# Patient Record
Sex: Male | Born: 1935 | Race: Black or African American | Hispanic: No
Health system: Southern US, Community
[De-identification: ages and names within clinical notes are randomized; demographics above are authoritative.]

---

## 2004-06-16 ENCOUNTER — Emergency Department: Payer: Self-pay | Admitting: Emergency Medicine

## 2005-08-25 ENCOUNTER — Ambulatory Visit: Payer: Self-pay | Admitting: Internal Medicine

## 2005-08-26 ENCOUNTER — Ambulatory Visit: Payer: Self-pay | Admitting: Internal Medicine

## 2007-04-04 ENCOUNTER — Ambulatory Visit: Payer: Self-pay | Admitting: Gastroenterology

## 2007-07-04 ENCOUNTER — Ambulatory Visit: Payer: Self-pay | Admitting: Gastroenterology

## 2007-07-24 ENCOUNTER — Ambulatory Visit: Payer: Self-pay | Admitting: Gastroenterology

## 2008-07-29 ENCOUNTER — Ambulatory Visit: Payer: Self-pay | Admitting: Internal Medicine

## 2008-08-26 ENCOUNTER — Ambulatory Visit: Payer: Self-pay | Admitting: Internal Medicine

## 2008-08-29 ENCOUNTER — Ambulatory Visit: Payer: Self-pay | Admitting: Internal Medicine

## 2008-09-15 ENCOUNTER — Emergency Department: Payer: Self-pay | Admitting: Emergency Medicine

## 2008-09-28 ENCOUNTER — Ambulatory Visit: Payer: Self-pay | Admitting: Internal Medicine

## 2008-10-29 ENCOUNTER — Ambulatory Visit: Payer: Self-pay | Admitting: Internal Medicine

## 2008-10-30 ENCOUNTER — Ambulatory Visit: Payer: Self-pay | Admitting: Nephrology

## 2008-10-31 ENCOUNTER — Ambulatory Visit: Payer: Self-pay | Admitting: Internal Medicine

## 2008-11-28 ENCOUNTER — Ambulatory Visit: Payer: Self-pay | Admitting: Internal Medicine

## 2008-12-29 ENCOUNTER — Ambulatory Visit: Payer: Self-pay | Admitting: Internal Medicine

## 2009-01-29 ENCOUNTER — Ambulatory Visit: Payer: Self-pay | Admitting: Internal Medicine

## 2009-01-30 ENCOUNTER — Ambulatory Visit: Payer: Self-pay | Admitting: Internal Medicine

## 2009-02-28 ENCOUNTER — Ambulatory Visit: Payer: Self-pay | Admitting: Internal Medicine

## 2009-03-18 ENCOUNTER — Ambulatory Visit: Payer: Self-pay | Admitting: Internal Medicine

## 2009-03-31 ENCOUNTER — Ambulatory Visit: Payer: Self-pay | Admitting: Internal Medicine

## 2009-04-30 ENCOUNTER — Ambulatory Visit: Payer: Self-pay | Admitting: Internal Medicine

## 2009-05-02 ENCOUNTER — Ambulatory Visit: Payer: Self-pay | Admitting: Internal Medicine

## 2009-05-13 ENCOUNTER — Ambulatory Visit: Payer: Self-pay | Admitting: Urology

## 2009-05-14 ENCOUNTER — Ambulatory Visit: Payer: Self-pay | Admitting: Internal Medicine

## 2009-05-31 ENCOUNTER — Ambulatory Visit: Payer: Self-pay | Admitting: Internal Medicine

## 2009-07-01 ENCOUNTER — Ambulatory Visit: Payer: Self-pay | Admitting: Internal Medicine

## 2009-07-29 ENCOUNTER — Ambulatory Visit: Payer: Self-pay | Admitting: Internal Medicine

## 2009-08-29 ENCOUNTER — Ambulatory Visit: Payer: Self-pay | Admitting: Internal Medicine

## 2009-09-28 ENCOUNTER — Ambulatory Visit: Payer: Self-pay | Admitting: Internal Medicine

## 2009-10-29 ENCOUNTER — Ambulatory Visit: Payer: Self-pay | Admitting: Internal Medicine

## 2009-11-28 ENCOUNTER — Ambulatory Visit: Payer: Self-pay | Admitting: Internal Medicine

## 2010-01-02 ENCOUNTER — Ambulatory Visit: Payer: Self-pay | Admitting: Internal Medicine

## 2010-01-21 ENCOUNTER — Ambulatory Visit: Payer: Self-pay | Admitting: Internal Medicine

## 2010-01-29 ENCOUNTER — Ambulatory Visit: Payer: Self-pay | Admitting: Internal Medicine

## 2010-02-28 ENCOUNTER — Ambulatory Visit: Payer: Self-pay | Admitting: Internal Medicine

## 2010-03-31 ENCOUNTER — Ambulatory Visit: Payer: Self-pay | Admitting: Internal Medicine

## 2010-04-30 ENCOUNTER — Ambulatory Visit: Payer: Self-pay | Admitting: Internal Medicine

## 2010-05-12 ENCOUNTER — Ambulatory Visit: Payer: Self-pay | Admitting: Urology

## 2010-05-22 ENCOUNTER — Inpatient Hospital Stay: Payer: Self-pay | Admitting: Surgery

## 2010-06-17 ENCOUNTER — Emergency Department: Payer: Self-pay | Admitting: Emergency Medicine

## 2010-07-14 ENCOUNTER — Ambulatory Visit: Payer: Self-pay | Admitting: Internal Medicine

## 2010-07-30 ENCOUNTER — Ambulatory Visit: Payer: Self-pay | Admitting: Internal Medicine

## 2010-08-11 ENCOUNTER — Ambulatory Visit: Payer: Self-pay | Admitting: Internal Medicine

## 2010-08-30 ENCOUNTER — Ambulatory Visit: Payer: Self-pay | Admitting: Internal Medicine

## 2010-09-29 ENCOUNTER — Ambulatory Visit: Payer: Self-pay | Admitting: Internal Medicine

## 2010-10-30 ENCOUNTER — Ambulatory Visit: Payer: Self-pay | Admitting: Internal Medicine

## 2010-11-29 ENCOUNTER — Ambulatory Visit: Payer: Self-pay | Admitting: Internal Medicine

## 2010-12-30 ENCOUNTER — Ambulatory Visit: Payer: Self-pay | Admitting: Internal Medicine

## 2011-01-30 ENCOUNTER — Ambulatory Visit: Payer: Self-pay | Admitting: Internal Medicine

## 2011-03-01 ENCOUNTER — Ambulatory Visit: Payer: Self-pay | Admitting: Internal Medicine

## 2011-03-10 ENCOUNTER — Other Ambulatory Visit: Payer: Self-pay | Admitting: Nephrology

## 2011-03-30 ENCOUNTER — Ambulatory Visit: Payer: Self-pay | Admitting: Internal Medicine

## 2011-04-01 ENCOUNTER — Ambulatory Visit: Payer: Self-pay | Admitting: Internal Medicine

## 2011-05-07 ENCOUNTER — Ambulatory Visit: Payer: Self-pay

## 2011-05-17 ENCOUNTER — Ambulatory Visit: Payer: Self-pay | Admitting: Urology

## 2011-05-26 ENCOUNTER — Ambulatory Visit: Payer: Self-pay | Admitting: Internal Medicine

## 2011-05-28 ENCOUNTER — Ambulatory Visit: Payer: Self-pay | Admitting: Vascular Surgery

## 2011-06-08 ENCOUNTER — Ambulatory Visit: Payer: Self-pay | Admitting: Internal Medicine

## 2011-06-11 ENCOUNTER — Ambulatory Visit: Payer: Self-pay | Admitting: Internal Medicine

## 2011-06-23 ENCOUNTER — Ambulatory Visit: Payer: Self-pay | Admitting: Vascular Surgery

## 2011-06-23 LAB — BASIC METABOLIC PANEL
Calcium, Total: 8 mg/dL — ABNORMAL LOW (ref 8.5–10.1)
Creatinine: 2.97 mg/dL — ABNORMAL HIGH (ref 0.60–1.30)
Glucose: 84 mg/dL (ref 65–99)
Sodium: 141 mmol/L (ref 136–145)

## 2011-07-02 ENCOUNTER — Ambulatory Visit: Payer: Self-pay | Admitting: Internal Medicine

## 2011-07-30 ENCOUNTER — Ambulatory Visit: Payer: Self-pay | Admitting: Internal Medicine

## 2011-09-06 ENCOUNTER — Inpatient Hospital Stay: Payer: Self-pay | Admitting: Internal Medicine

## 2011-09-06 LAB — URINALYSIS, COMPLETE
Bacteria: NONE SEEN
Bilirubin,UR: NEGATIVE
Blood: NEGATIVE
Ph: 7 (ref 4.5–8.0)
Protein: 100
Squamous Epithelial: NONE SEEN
WBC UR: NONE SEEN /HPF (ref 0–5)

## 2011-09-06 LAB — BASIC METABOLIC PANEL
Calcium, Total: 8 mg/dL — ABNORMAL LOW (ref 8.5–10.1)
Co2: 22 mmol/L (ref 21–32)
Creatinine: 3.26 mg/dL — ABNORMAL HIGH (ref 0.60–1.30)
EGFR (African American): 24 — ABNORMAL LOW
Potassium: 6.8 mmol/L (ref 3.5–5.1)

## 2011-09-06 LAB — CBC WITH DIFFERENTIAL/PLATELET
Basophil %: 0.5 %
HCT: 27.5 % — ABNORMAL LOW (ref 40.0–52.0)
HGB: 8.8 g/dL — ABNORMAL LOW (ref 13.0–18.0)
Lymphocyte #: 1.1 10*3/uL (ref 1.0–3.6)
Lymphocyte %: 17.2 %
MCH: 26.4 pg (ref 26.0–34.0)
MCHC: 32.1 g/dL (ref 32.0–36.0)
MCV: 82 fL (ref 80–100)
Neutrophil #: 3.8 10*3/uL (ref 1.4–6.5)
Platelet: 149 10*3/uL — ABNORMAL LOW (ref 150–440)
RBC: 3.34 10*6/uL — ABNORMAL LOW (ref 4.40–5.90)

## 2011-09-06 LAB — POTASSIUM
Potassium: 6.7 mmol/L (ref 3.5–5.1)
Potassium: 5.8 mmol/L — ABNORMAL HIGH (ref 3.5–5.1)

## 2011-09-07 LAB — BASIC METABOLIC PANEL
Anion Gap: 8 (ref 7–16)
Chloride: 107 mmol/L (ref 98–107)
Co2: 25 mmol/L (ref 21–32)
Creatinine: 2.37 mg/dL — ABNORMAL HIGH (ref 0.60–1.30)
EGFR (African American): 34 — ABNORMAL LOW
EGFR (Non-African Amer.): 28 — ABNORMAL LOW
Glucose: 74 mg/dL (ref 65–99)
Potassium: 5.3 mmol/L — ABNORMAL HIGH (ref 3.5–5.1)

## 2011-09-07 LAB — CBC WITH DIFFERENTIAL/PLATELET
Basophil %: 0.3 %
HCT: 24.9 % — ABNORMAL LOW (ref 40.0–52.0)
HGB: 7.9 g/dL — ABNORMAL LOW (ref 13.0–18.0)
Lymphocyte #: 1.5 10*3/uL (ref 1.0–3.6)
Lymphocyte %: 23.5 %
MCH: 26.1 pg (ref 26.0–34.0)
MCHC: 31.7 g/dL — ABNORMAL LOW (ref 32.0–36.0)
Monocyte #: 0.6 10*3/uL (ref 0.0–0.7)
Platelet: 105 10*3/uL — ABNORMAL LOW (ref 150–440)
RBC: 3.02 10*6/uL — ABNORMAL LOW (ref 4.40–5.90)

## 2011-09-07 LAB — CREATININE CLEARANCE, URINE, 24 HOUR
Creatinine Clearance: 23 mL/min — ABNORMAL LOW (ref 80–130)
Creatinine, Serum: 2.37 mg/dL — ABNORMAL HIGH (ref 0.50–1.20)
Creatinine, Urine: 75.5 mg/dL (ref 30.0–125.0)
Total Volume: 875 mL

## 2011-09-08 LAB — CBC WITH DIFFERENTIAL/PLATELET
Basophil #: 0 10*3/uL (ref 0.0–0.1)
Basophil %: 0.3 %
Eosinophil #: 1 10*3/uL — ABNORMAL HIGH (ref 0.0–0.7)
MCH: 26.4 pg (ref 26.0–34.0)
MCV: 82 fL (ref 80–100)
Neutrophil #: 3.7 10*3/uL (ref 1.4–6.5)
Neutrophil %: 56.6 %
Platelet: 107 10*3/uL — ABNORMAL LOW (ref 150–440)
RDW: 17.9 % — ABNORMAL HIGH (ref 11.5–14.5)
WBC: 6.5 10*3/uL (ref 3.8–10.6)

## 2011-09-08 LAB — BASIC METABOLIC PANEL
Anion Gap: 7 (ref 7–16)
BUN: 34 mg/dL — ABNORMAL HIGH (ref 7–18)
Calcium, Total: 7.2 mg/dL — ABNORMAL LOW (ref 8.5–10.1)
Chloride: 109 mmol/L — ABNORMAL HIGH (ref 98–107)
Glucose: 73 mg/dL (ref 65–99)
Osmolality: 284 (ref 275–301)
Sodium: 139 mmol/L (ref 136–145)

## 2011-09-09 LAB — CBC WITH DIFFERENTIAL/PLATELET
Eosinophil #: 0 10*3/uL (ref 0.0–0.7)
HCT: 26.8 % — ABNORMAL LOW (ref 40.0–52.0)
HGB: 8.7 g/dL — ABNORMAL LOW (ref 13.0–18.0)
Lymphocyte #: 0.5 10*3/uL — ABNORMAL LOW (ref 1.0–3.6)
Lymphocyte %: 9.3 %
MCH: 26.9 pg (ref 26.0–34.0)
MCHC: 32.3 g/dL (ref 32.0–36.0)
MCV: 83 fL (ref 80–100)
Neutrophil #: 4.4 10*3/uL (ref 1.4–6.5)
Neutrophil %: 78.9 %
Platelet: 63 10*3/uL — ABNORMAL LOW (ref 150–440)
RBC: 3.23 10*6/uL — ABNORMAL LOW (ref 4.40–5.90)
WBC: 5.6 10*3/uL (ref 3.8–10.6)

## 2011-09-09 LAB — BASIC METABOLIC PANEL
Anion Gap: 8 (ref 7–16)
BUN: 29 mg/dL — ABNORMAL HIGH (ref 7–18)
Calcium, Total: 7.3 mg/dL — ABNORMAL LOW (ref 8.5–10.1)
Chloride: 104 mmol/L (ref 98–107)
Co2: 27 mmol/L (ref 21–32)
Creatinine: 2.08 mg/dL — ABNORMAL HIGH (ref 0.60–1.30)
EGFR (Non-African Amer.): 33 — ABNORMAL LOW
Sodium: 139 mmol/L (ref 136–145)

## 2011-09-14 ENCOUNTER — Ambulatory Visit: Payer: Self-pay | Admitting: Internal Medicine

## 2011-09-22 ENCOUNTER — Ambulatory Visit: Payer: Self-pay | Admitting: Vascular Surgery

## 2011-09-22 LAB — BASIC METABOLIC PANEL
BUN: 46 mg/dL — ABNORMAL HIGH (ref 7–18)
Creatinine: 4.03 mg/dL — ABNORMAL HIGH (ref 0.60–1.30)
EGFR (African American): 15 — ABNORMAL LOW
Glucose: 76 mg/dL (ref 65–99)
Potassium: 4.5 mmol/L (ref 3.5–5.1)
Sodium: 140 mmol/L (ref 136–145)

## 2011-09-22 LAB — CBC
MCH: 26.5 pg (ref 26.0–34.0)
MCHC: 31.7 g/dL — ABNORMAL LOW (ref 32.0–36.0)
MCV: 84 fL (ref 80–100)
WBC: 7.6 10*3/uL (ref 3.8–10.6)

## 2011-09-27 ENCOUNTER — Other Ambulatory Visit: Payer: Self-pay | Admitting: Vascular Surgery

## 2011-09-29 ENCOUNTER — Ambulatory Visit: Payer: Self-pay | Admitting: Vascular Surgery

## 2011-09-29 LAB — POTASSIUM: Potassium: 4.4 mmol/L (ref 3.5–5.1)

## 2011-09-30 ENCOUNTER — Ambulatory Visit: Payer: Self-pay | Admitting: Internal Medicine

## 2011-11-09 ENCOUNTER — Ambulatory Visit: Payer: Self-pay | Admitting: Internal Medicine

## 2011-11-09 LAB — IRON AND TIBC
Iron Bind.Cap.(Total): 178 ug/dL — ABNORMAL LOW (ref 250–450)
Iron: 70 ug/dL (ref 65–175)
Unbound Iron-Bind.Cap.: 108 ug/dL

## 2011-11-09 LAB — CANCER CENTER HEMOGLOBIN: HGB: 8 g/dL — ABNORMAL LOW (ref 13.0–18.0)

## 2011-11-29 ENCOUNTER — Ambulatory Visit: Payer: Self-pay | Admitting: Internal Medicine

## 2011-11-30 LAB — CANCER CENTER HEMOGLOBIN: HGB: 10.7 g/dL — ABNORMAL LOW (ref 13.0–18.0)

## 2011-12-06 ENCOUNTER — Ambulatory Visit: Payer: Self-pay | Admitting: Internal Medicine

## 2011-12-14 ENCOUNTER — Ambulatory Visit: Payer: Self-pay | Admitting: Internal Medicine

## 2011-12-16 ENCOUNTER — Ambulatory Visit: Payer: Self-pay | Admitting: Internal Medicine

## 2011-12-16 LAB — BASIC METABOLIC PANEL
Anion Gap: 10 (ref 7–16)
Calcium, Total: 7.8 mg/dL — ABNORMAL LOW (ref 8.5–10.1)
Co2: 22 mmol/L (ref 21–32)
Creatinine: 5.39 mg/dL — ABNORMAL HIGH (ref 0.60–1.30)
EGFR (African American): 11 — ABNORMAL LOW

## 2011-12-16 LAB — CBC WITH DIFFERENTIAL/PLATELET
Basophil %: 0.4 %
Eosinophil #: 0.4 10*3/uL (ref 0.0–0.7)
HCT: 37.1 % — ABNORMAL LOW (ref 40.0–52.0)
MCH: 24.3 pg — ABNORMAL LOW (ref 26.0–34.0)
MCHC: 29.9 g/dL — ABNORMAL LOW (ref 32.0–36.0)
MCV: 81 fL (ref 80–100)
Neutrophil %: 85.7 %
Platelet: 292 10*3/uL (ref 150–440)

## 2011-12-28 LAB — CANCER CENTER HEMOGLOBIN: HGB: 11.6 g/dL — ABNORMAL LOW (ref 13.0–18.0)

## 2011-12-30 ENCOUNTER — Ambulatory Visit: Payer: Self-pay | Admitting: Internal Medicine

## 2012-01-01 ENCOUNTER — Inpatient Hospital Stay: Payer: Self-pay | Admitting: Internal Medicine

## 2012-01-01 LAB — COMPREHENSIVE METABOLIC PANEL
Albumin: 2.4 g/dL — ABNORMAL LOW (ref 3.4–5.0)
Anion Gap: 11 (ref 7–16)
Calcium, Total: 7.9 mg/dL — ABNORMAL LOW (ref 8.5–10.1)
Chloride: 102 mmol/L (ref 98–107)
Co2: 23 mmol/L (ref 21–32)
EGFR (African American): 9 — ABNORMAL LOW
EGFR (Non-African Amer.): 8 — ABNORMAL LOW
Osmolality: 284 (ref 275–301)
Potassium: 4.4 mmol/L (ref 3.5–5.1)
SGOT(AST): 18 U/L (ref 15–37)
SGPT (ALT): 8 U/L — ABNORMAL LOW (ref 12–78)
Sodium: 136 mmol/L (ref 136–145)

## 2012-01-01 LAB — URINALYSIS, COMPLETE
Bacteria: NONE SEEN
Bilirubin,UR: NEGATIVE
Blood: NEGATIVE
Glucose,UR: NEGATIVE mg/dL (ref 0–75)
Leukocyte Esterase: NEGATIVE
Nitrite: NEGATIVE
Ph: 6 (ref 4.5–8.0)
Protein: >=500
Specific Gravity: 1.011 (ref 1.003–1.030)

## 2012-01-01 LAB — TROPONIN I: Troponin-I: <0.02 ng/mL

## 2012-01-01 LAB — CBC
HCT: 37.7 % — ABNORMAL LOW (ref 40.0–52.0)
MCHC: 31.7 g/dL — ABNORMAL LOW (ref 32.0–36.0)
RBC: 4.93 10*6/uL (ref 4.40–5.90)
RDW: 20.6 % — ABNORMAL HIGH (ref 11.5–14.5)
WBC: 9.1 10*3/uL (ref 3.8–10.6)

## 2012-01-01 LAB — CK TOTAL AND CKMB (NOT AT ARMC): CK-MB: 1.4 ng/mL (ref 0.5–3.6)

## 2012-01-02 LAB — CLOSTRIDIUM DIFFICILE BY PCR

## 2012-01-02 LAB — CBC WITH DIFFERENTIAL/PLATELET
Basophil #: 0 10*3/uL (ref 0.0–0.1)
Eosinophil #: 0.9 10*3/uL — ABNORMAL HIGH (ref 0.0–0.7)
HCT: 34.6 % — ABNORMAL LOW (ref 40.0–52.0)
Lymphocyte #: 0.8 10*3/uL — ABNORMAL LOW (ref 1.0–3.6)
MCHC: 31.1 g/dL — ABNORMAL LOW (ref 32.0–36.0)
Monocyte #: 0.6 x10 3/mm (ref 0.2–1.0)
Neutrophil %: 68.5 %
Platelet: 236 10*3/uL (ref 150–440)
RBC: 4.54 10*6/uL (ref 4.40–5.90)
WBC: 7.2 10*3/uL (ref 3.8–10.6)

## 2012-01-02 LAB — BASIC METABOLIC PANEL
Calcium, Total: 7.4 mg/dL — ABNORMAL LOW (ref 8.5–10.1)
Co2: 20 mmol/L — ABNORMAL LOW (ref 21–32)
Creatinine: 5.68 mg/dL — ABNORMAL HIGH (ref 0.60–1.30)
EGFR (African American): 10 — ABNORMAL LOW
Osmolality: 284 (ref 275–301)
Potassium: 4 mmol/L (ref 3.5–5.1)
Sodium: 136 mmol/L (ref 136–145)

## 2012-01-03 LAB — WBCS, STOOL

## 2012-01-04 LAB — BASIC METABOLIC PANEL
Anion Gap: 13 (ref 7–16)
Calcium, Total: 7.2 mg/dL — ABNORMAL LOW (ref 8.5–10.1)
Co2: 20 mmol/L — ABNORMAL LOW (ref 21–32)
Creatinine: 6.15 mg/dL — ABNORMAL HIGH (ref 0.60–1.30)
EGFR (African American): 9 — ABNORMAL LOW
EGFR (Non-African Amer.): 8 — ABNORMAL LOW
Glucose: 93 mg/dL (ref 65–99)
Potassium: 3.8 mmol/L (ref 3.5–5.1)
Sodium: 135 mmol/L — ABNORMAL LOW (ref 136–145)

## 2012-01-04 LAB — STOOL CULTURE

## 2012-01-05 LAB — PHOSPHORUS: Phosphorus: 4.8 mg/dL (ref 2.5–4.9)

## 2012-01-06 LAB — CBC WITH DIFFERENTIAL/PLATELET
Comment - H1-Com4: NORMAL
HGB: 10.5 g/dL — ABNORMAL LOW (ref 13.0–18.0)
Lymphocytes: 11 %
MCH: 23.7 pg — ABNORMAL LOW (ref 26.0–34.0)
MCHC: 31.7 g/dL — ABNORMAL LOW (ref 32.0–36.0)
RBC: 4.43 10*6/uL (ref 4.40–5.90)
Segmented Neutrophils: 74 %

## 2012-01-07 LAB — PHOSPHORUS: Phosphorus: 2.7 mg/dL (ref 2.5–4.9)

## 2012-01-09 LAB — IRON AND TIBC
Iron Saturation: 23 %
Iron: 34 ug/dL — ABNORMAL LOW (ref 65–175)

## 2012-01-09 LAB — CBC WITH DIFFERENTIAL/PLATELET
Basophil #: 0 10*3/uL (ref 0.0–0.1)
Basophil %: 0.4 %
Eosinophil #: 0.6 10*3/uL (ref 0.0–0.7)
Eosinophil %: 7.3 %
HGB: 9.8 g/dL — ABNORMAL LOW (ref 13.0–18.0)
Lymphocyte %: 10.5 %
MCH: 23.5 pg — ABNORMAL LOW (ref 26.0–34.0)
MCV: 76 fL — ABNORMAL LOW (ref 80–100)
Monocyte #: 0.8 x10 3/mm (ref 0.2–1.0)
Neutrophil %: 71.3 %
Platelet: 77 10*3/uL — ABNORMAL LOW (ref 150–440)
RDW: 20.4 % — ABNORMAL HIGH (ref 11.5–14.5)

## 2012-01-10 LAB — CBC WITH DIFFERENTIAL/PLATELET
Basophil %: 0.5 %
Eosinophil %: 6.2 %
HGB: 9.7 g/dL — ABNORMAL LOW (ref 13.0–18.0)
Lymphocyte #: 0.8 10*3/uL — ABNORMAL LOW (ref 1.0–3.6)
MCH: 23.7 pg — ABNORMAL LOW (ref 26.0–34.0)
MCV: 74 fL — ABNORMAL LOW (ref 80–100)
Monocyte #: 0.7 x10 3/mm (ref 0.2–1.0)
Monocyte %: 10.9 %
Platelet: 103 10*3/uL — ABNORMAL LOW (ref 150–440)
RBC: 4.08 10*6/uL — ABNORMAL LOW (ref 4.40–5.90)
RDW: 20.6 % — ABNORMAL HIGH (ref 11.5–14.5)
WBC: 6.7 10*3/uL (ref 3.8–10.6)

## 2012-01-10 LAB — PHOSPHORUS: Phosphorus: 3.6 mg/dL (ref 2.5–4.9)

## 2012-01-12 ENCOUNTER — Ambulatory Visit: Payer: Self-pay | Admitting: Internal Medicine

## 2012-01-13 LAB — STOOL CULTURE

## 2012-01-15 ENCOUNTER — Emergency Department: Payer: Self-pay | Admitting: Internal Medicine

## 2012-01-30 ENCOUNTER — Ambulatory Visit: Payer: Self-pay | Admitting: Internal Medicine

## 2012-02-02 ENCOUNTER — Other Ambulatory Visit (HOSPITAL_COMMUNITY): Payer: Self-pay | Admitting: Cardiothoracic Surgery

## 2012-02-02 DIAGNOSIS — R918 Other nonspecific abnormal finding of lung field: Secondary | ICD-10-CM

## 2012-02-02 LAB — CBC CANCER CENTER
Basophil #: 0.1 x10 3/mm (ref 0.0–0.1)
Eosinophil #: 0.3 x10 3/mm (ref 0.0–0.7)
HCT: 38.2 % — ABNORMAL LOW (ref 40.0–52.0)
Lymphocyte #: 1.1 x10 3/mm (ref 1.0–3.6)
Lymphocyte %: 15.2 %
MCHC: 31 g/dL — ABNORMAL LOW (ref 32.0–36.0)
Monocyte #: 0.7 x10 3/mm (ref 0.2–1.0)
Monocyte %: 9.2 %
Neutrophil #: 5.3 x10 3/mm (ref 1.4–6.5)
Neutrophil %: 71.5 %
Platelet: 212 x10 3/mm (ref 150–440)
RDW: 24.6 % — ABNORMAL HIGH (ref 11.5–14.5)
WBC: 7.4 x10 3/mm (ref 3.8–10.6)

## 2012-02-02 LAB — COMPREHENSIVE METABOLIC PANEL
Albumin: 2.7 g/dL — ABNORMAL LOW (ref 3.4–5.0)
Alkaline Phosphatase: 103 U/L (ref 50–136)
BUN: 30 mg/dL — ABNORMAL HIGH (ref 7–18)
Bilirubin,Total: 0.3 mg/dL (ref 0.2–1.0)
Co2: 30 mmol/L (ref 21–32)
Creatinine: 3.89 mg/dL — ABNORMAL HIGH (ref 0.60–1.30)
EGFR (Non-African Amer.): 14 — ABNORMAL LOW
Glucose: 85 mg/dL (ref 65–99)
SGOT(AST): 17 U/L (ref 15–37)
SGPT (ALT): 10 U/L — ABNORMAL LOW (ref 12–78)
Total Protein: 7.3 g/dL (ref 6.4–8.2)

## 2012-02-02 LAB — PROTIME-INR: INR: 0.9

## 2012-02-02 LAB — APTT: Activated PTT: 31.2 secs (ref 23.6–35.9)

## 2012-02-10 ENCOUNTER — Other Ambulatory Visit: Payer: Self-pay | Admitting: Physician Assistant

## 2012-02-15 ENCOUNTER — Other Ambulatory Visit: Payer: Self-pay | Admitting: Radiology

## 2012-02-15 ENCOUNTER — Other Ambulatory Visit (HOSPITAL_COMMUNITY): Payer: Self-pay

## 2012-02-15 ENCOUNTER — Ambulatory Visit (HOSPITAL_COMMUNITY): Payer: Self-pay

## 2012-02-16 ENCOUNTER — Encounter (HOSPITAL_COMMUNITY): Payer: Self-pay

## 2012-02-16 ENCOUNTER — Encounter (HOSPITAL_COMMUNITY): Payer: Self-pay | Admitting: Pharmacy Technician

## 2012-02-16 ENCOUNTER — Ambulatory Visit (HOSPITAL_COMMUNITY)
Admission: RE | Admit: 2012-02-16 | Discharge: 2012-02-16 | Disposition: A | Discharge status: Home / Self Care | Payer: Medicare Other | Source: Ambulatory Visit | Attending: Cardiothoracic Surgery | Admitting: Cardiothoracic Surgery

## 2012-02-16 DIAGNOSIS — R918 Other nonspecific abnormal finding of lung field: Secondary | ICD-10-CM

## 2012-02-16 DIAGNOSIS — J984 Other disorders of lung: Secondary | ICD-10-CM | POA: Insufficient documentation

## 2012-02-16 DIAGNOSIS — R222 Localized swelling, mass and lump, trunk: Secondary | ICD-10-CM | POA: Insufficient documentation

## 2012-02-16 LAB — CBC
HCT: 34 % — ABNORMAL LOW (ref 39.0–52.0)
Platelets: 195 10*3/uL (ref 150–400)
RBC: 4.53 MIL/uL (ref 4.22–5.81)
RDW: 21.1 % — ABNORMAL HIGH (ref 11.5–15.5)
WBC: 7.5 10*3/uL (ref 4.0–10.5)

## 2012-02-16 LAB — PROTIME-INR: INR: 0.98 (ref 0.00–1.49)

## 2012-02-16 LAB — APTT: aPTT: 31 seconds (ref 24–37)

## 2012-02-16 MED ORDER — MIDAZOLAM HCL 2 MG/2ML IJ SOLN
INTRAMUSCULAR | Status: AC
  Filled 2012-02-16: qty 6

## 2012-02-16 MED ORDER — SODIUM CHLORIDE 0.9 % IV SOLN: INTRAVENOUS | Status: DC

## 2012-02-16 MED ORDER — SODIUM CHLORIDE 0.9 % IV SOLN
INTRAVENOUS | Status: AC | PRN
  Administered 2012-02-16: 50 mL/h via INTRAVENOUS

## 2012-02-16 MED ORDER — FENTANYL CITRATE 0.05 MG/ML IJ SOLN
INTRAMUSCULAR | Status: AC | PRN
  Administered 2012-02-16: 25 ug via INTRAVENOUS
  Administered 2012-02-16: 12.5 ug via INTRAVENOUS

## 2012-02-16 MED ORDER — FENTANYL CITRATE 0.05 MG/ML IJ SOLN
INTRAMUSCULAR | Status: AC
  Filled 2012-02-16: qty 4

## 2012-02-16 MED ORDER — HYDROCODONE-ACETAMINOPHEN 5-325 MG PO TABS: 1.0000 | ORAL_TABLET | ORAL | Status: DC | PRN

## 2012-02-16 MED ORDER — MIDAZOLAM HCL 5 MG/5ML IJ SOLN
INTRAMUSCULAR | Status: AC | PRN
  Administered 2012-02-16: 1 mg via INTRAVENOUS

## 2012-02-16 NOTE — H&P (Signed)
[name] is an 78 y.o. male.   Chief Complaint: left lung mass HPI: Patient with prior hx of TB , smoking and nondiagnostic biopsy of left lung mass in [place] presents today for repeat CT guided LUL lung mass biopsy.  PMH: HTN, COPD, ESRD PSH: vasc access procedure- left arm AVGG No family history on file. Social History: lives in [place], widowed; 1 daughter, prior smoker, prior alcohol use  Allergies:  Allergies  Allergen Reactions  . Aspirin Other (See Comments)    Reaction unknown  . Sulfa Antibiotics Other (See Comments)    Reaction unknown    Current outpatient prescriptions:albuterol (PROVENTIL HFA;VENTOLIN HFA) 108 (90 BASE) MCG/ACT inhaler, Inhale 2 puffs into the lungs every 4 (four) hours as needed. For shortness of breath, Disp: , Rfl: ;  b complex-vitamin c-folic acid (NEPHRO-VITE) 0.8 MG TABS, Take 0.8 mg by mouth at bedtime., Disp: , Rfl: ;  carvedilol (COREG) 12.5 MG tablet, Take 12.5 mg by mouth every evening., Disp: , Rfl:  Fluticasone-Salmeterol (ADVAIR) 250-50 MCG/DOSE AEPB, Inhale 1 puff into the lungs every 12 (twelve) hours., Disp: , Rfl: ;  megestrol (MEGACE) 40 MG/ML suspension, Take 400 mg by mouth daily., Disp: , Rfl:  Current facility-administered medications:0.9 %  sodium chloride infusion, , Intravenous, Continuous, Dayne Oley Balm III, MD   Results for orders placed during the hospital encounter of 02/16/12 (from the past 48 hour(s))  APTT     Status: Normal   Collection Time   02/16/12 10:11 AM      Component Value Range Comment   aPTT 31  24 - 37 seconds   CBC     Status: Abnormal   Collection Time   02/16/12 10:11 AM      Component Value Range Comment   WBC 7.5  4.0 - 10.5 K/uL    RBC 4.53  4.22 - 5.81 MIL/uL    Hemoglobin 10.7 (*) 13.0 - 17.0 g/dL    HCT 16.1 (*) 09.6 - 52.0 %    MCV 75.1 (*) 78.0 - 100.0 fL    MCH 23.6 (*) 26.0 - 34.0 pg    MCHC 31.5  30.0 - 36.0 g/dL    RDW 04.5 (*) 40.9 - 15.5 %    Platelets 195  150 -  400 K/uL   PROTIME-INR     Status: Normal   Collection Time   02/16/12 10:11 AM      Component Value Range Comment   Prothrombin Time 12.9  11.6 - 15.2 seconds    INR 0.98  0.00 - 1.49    No results found.  Review of Systems  Constitutional: Positive for weight loss. Negative for fever and chills.  Respiratory: Positive for shortness of breath.        Occ cough  Cardiovascular: Negative for chest pain.  Gastrointestinal: Negative for nausea, vomiting and abdominal pain.  Musculoskeletal: Negative for back pain.  Neurological: Negative for headaches.  Endo/Heme/Allergies: Does not bruise/bleed easily.    Blood pressure 199/57, pulse 59, temperature 97 F (36.1 C), temperature source Oral, resp. rate 14, height 5' 9.5" (1.765 m), weight 95 lb (43.092 kg), SpO2 95.00%. Physical Exam  Constitutional: He is oriented to person, place, and time.       Cachetic BM in NAD  Cardiovascular: Normal rate and regular rhythm.   Respiratory: Effort normal.       distant BS bilat  GI: Soft. Bowel sounds are normal. There is no tenderness.  Musculoskeletal: Normal range of  motion. He exhibits no edema.  Neurological: He is alert and oriented to person, place, and time.     Assessment/Plan Patient with COPD ,prior Tb, prior tobacco use and LUL lung mass; plan is for CT guided biopsy of the left lung mass today. Details/risks of procedure d/w pt /family with their understanding and consent.  Taylyn Brame,D KEVIN 02/16/2012, 10:51 AM

## 2012-02-16 NOTE — Procedures (Signed)
CT core LUL lung 18g x5 No complication No blood loss. See complete dictation in [facility].

## 2012-02-16 NOTE — Procedures (Deleted)
CT core bx RUL lung 18g x5 No complication No blood loss. See complete dictation in [facility].

## 2012-02-19 LAB — TISSUE CULTURE
Culture: NO GROWTH
Gram Stain: NONE SEEN

## 2012-02-29 ENCOUNTER — Ambulatory Visit: Payer: Self-pay | Admitting: Internal Medicine

## 2012-03-03 ENCOUNTER — Ambulatory Visit: Payer: Self-pay | Admitting: Vascular Surgery

## 2012-03-04 ENCOUNTER — Ambulatory Visit: Payer: Self-pay | Admitting: Internal Medicine

## 2012-03-15 LAB — FUNGUS CULTURE W SMEAR: Fungal Smear: NONE SEEN

## 2012-03-18 LAB — CBC
HCT: 37.7 % — ABNORMAL LOW (ref 40.0–52.0)
HGB: 12.2 g/dL — ABNORMAL LOW (ref 13.0–18.0)
Platelet: 160 10*3/uL (ref 150–440)
WBC: 8 10*3/uL (ref 3.8–10.6)

## 2012-03-18 LAB — COMPREHENSIVE METABOLIC PANEL
Albumin: 2.8 g/dL — ABNORMAL LOW (ref 3.4–5.0)
Anion Gap: 9 (ref 7–16)
BUN: 12 mg/dL (ref 7–18)
Bilirubin,Total: 0.7 mg/dL (ref 0.2–1.0)
Calcium, Total: 8 mg/dL — ABNORMAL LOW (ref 8.5–10.1)
Chloride: 103 mmol/L (ref 98–107)
Creatinine: 2.22 mg/dL — ABNORMAL HIGH (ref 0.60–1.30)
EGFR (African American): 31 — ABNORMAL LOW
Glucose: 68 mg/dL (ref 65–99)
Osmolality: 266 (ref 275–301)
Potassium: 3.7 mmol/L (ref 3.5–5.1)
SGPT (ALT): 11 U/L — ABNORMAL LOW (ref 12–78)
Sodium: 134 mmol/L — ABNORMAL LOW (ref 136–145)
Total Protein: 6.9 g/dL (ref 6.4–8.2)

## 2012-03-18 LAB — TROPONIN I: Troponin-I: 0.03 ng/mL

## 2012-03-19 ENCOUNTER — Inpatient Hospital Stay: Payer: Self-pay | Admitting: Internal Medicine

## 2012-03-19 DIAGNOSIS — R0602 Shortness of breath: Secondary | ICD-10-CM

## 2012-03-20 LAB — PROTIME-INR
INR: 1
Prothrombin Time: 13.2 secs (ref 11.5–14.7)

## 2012-03-20 LAB — BASIC METABOLIC PANEL
BUN: 31 mg/dL — ABNORMAL HIGH (ref 7–18)
Chloride: 101 mmol/L (ref 98–107)
EGFR (African American): 15 — ABNORMAL LOW
Glucose: 91 mg/dL (ref 65–99)
Osmolality: 274 (ref 275–301)
Potassium: 3.8 mmol/L (ref 3.5–5.1)
Sodium: 134 mmol/L — ABNORMAL LOW (ref 136–145)

## 2012-03-20 LAB — LACTATE DEHYDROGENASE: LDH: 214 U/L (ref 85–241)

## 2012-03-20 LAB — APTT: Activated PTT: 33.8 secs (ref 23.6–35.9)

## 2012-03-20 LAB — ALBUMIN: Albumin: 2.4 g/dL — ABNORMAL LOW (ref 3.4–5.0)

## 2012-03-30 LAB — AFB CULTURE WITH SMEAR (NOT AT ARMC)

## 2012-04-05 ENCOUNTER — Ambulatory Visit: Payer: Self-pay | Admitting: Physician Assistant

## 2012-04-12 ENCOUNTER — Inpatient Hospital Stay: Payer: Self-pay | Admitting: Specialist

## 2012-04-12 LAB — URINALYSIS, COMPLETE
Bilirubin,UR: NEGATIVE
Ketone: NEGATIVE
Leukocyte Esterase: NEGATIVE
Nitrite: NEGATIVE
Ph: 5 (ref 4.5–8.0)
Protein: >=500
RBC,UR: 3 /HPF (ref 0–5)
Specific Gravity: 1.018 (ref 1.003–1.030)

## 2012-04-12 LAB — COMPREHENSIVE METABOLIC PANEL
Albumin: 3.2 g/dL — ABNORMAL LOW (ref 3.4–5.0)
Anion Gap: 7 (ref 7–16)
BUN: 18 mg/dL (ref 7–18)
Bilirubin,Total: 0.4 mg/dL (ref 0.2–1.0)
Chloride: 104 mmol/L (ref 98–107)
Co2: 27 mmol/L (ref 21–32)
Creatinine: 3.53 mg/dL — ABNORMAL HIGH (ref 0.60–1.30)
EGFR (African American): 18 — ABNORMAL LOW
Glucose: 75 mg/dL (ref 65–99)
Osmolality: 276 (ref 275–301)
Potassium: 4.2 mmol/L (ref 3.5–5.1)
SGOT(AST): 30 U/L (ref 15–37)
Sodium: 138 mmol/L (ref 136–145)
Total Protein: 7.2 g/dL (ref 6.4–8.2)

## 2012-04-12 LAB — CBC
HCT: 40.6 % (ref 40.0–52.0)
MCH: 27.2 pg (ref 26.0–34.0)
MCHC: 31.4 g/dL — ABNORMAL LOW (ref 32.0–36.0)
MCV: 86 fL (ref 80–100)
Platelet: 126 10*3/uL — ABNORMAL LOW (ref 150–440)
RDW: 21.5 % — ABNORMAL HIGH (ref 11.5–14.5)
WBC: 7.6 10*3/uL (ref 3.8–10.6)

## 2012-04-12 LAB — CK TOTAL AND CKMB (NOT AT ARMC): CK-MB: 1.7 ng/mL (ref 0.5–3.6)

## 2012-04-12 LAB — PRO B NATRIURETIC PEPTIDE: B-Type Natriuretic Peptide: 16070 pg/mL — ABNORMAL HIGH (ref 0–450)

## 2012-04-13 LAB — COMPREHENSIVE METABOLIC PANEL
Albumin: 2.7 g/dL — ABNORMAL LOW (ref 3.4–5.0)
Alkaline Phosphatase: 67 U/L (ref 50–136)
BUN: 31 mg/dL — ABNORMAL HIGH (ref 7–18)
Bilirubin,Total: 0.3 mg/dL (ref 0.2–1.0)
Calcium, Total: 8.1 mg/dL — ABNORMAL LOW (ref 8.5–10.1)
Co2: 24 mmol/L (ref 21–32)
EGFR (Non-African Amer.): 12 — ABNORMAL LOW
Glucose: 91 mg/dL (ref 65–99)
SGPT (ALT): 13 U/L (ref 12–78)
Sodium: 137 mmol/L (ref 136–145)

## 2012-04-13 LAB — CBC WITH DIFFERENTIAL/PLATELET
Basophil #: 0 10*3/uL (ref 0.0–0.1)
Basophil %: 0.3 %
Eosinophil #: 0 10*3/uL (ref 0.0–0.7)
Eosinophil %: 0.1 %
HGB: 11.5 g/dL — ABNORMAL LOW (ref 13.0–18.0)
Lymphocyte #: 0.7 10*3/uL — ABNORMAL LOW (ref 1.0–3.6)
MCH: 26.8 pg (ref 26.0–34.0)
MCV: 85 fL (ref 80–100)
Monocyte #: 0.6 x10 3/mm (ref 0.2–1.0)
Neutrophil #: 3.1 10*3/uL (ref 1.4–6.5)
RBC: 4.29 10*6/uL — ABNORMAL LOW (ref 4.40–5.90)
RDW: 21.3 % — ABNORMAL HIGH (ref 11.5–14.5)

## 2012-04-13 LAB — MAGNESIUM: Magnesium: 1.8 mg/dL

## 2012-04-18 LAB — CULTURE, BLOOD (SINGLE)

## 2012-05-15 ENCOUNTER — Ambulatory Visit: Payer: Self-pay | Admitting: Urology

## 2012-07-29 ENCOUNTER — Ambulatory Visit: Payer: Self-pay | Admitting: Internal Medicine

## 2012-07-29 LAB — COMPREHENSIVE METABOLIC PANEL
Albumin: 3 g/dL — ABNORMAL LOW (ref 3.4–5.0)
Alkaline Phosphatase: 69 U/L (ref 50–136)
BUN: 45 mg/dL — ABNORMAL HIGH (ref 7–18)
Bilirubin,Total: 0.4 mg/dL (ref 0.2–1.0)
Calcium, Total: 8 mg/dL — ABNORMAL LOW (ref 8.5–10.1)
Chloride: 103 mmol/L (ref 98–107)
Co2: 24 mmol/L (ref 21–32)
Creatinine: 6.97 mg/dL — ABNORMAL HIGH (ref 0.60–1.30)
EGFR (Non-African Amer.): 7 — ABNORMAL LOW
Total Protein: 7.3 g/dL (ref 6.4–8.2)

## 2012-07-29 LAB — CK TOTAL AND CKMB (NOT AT ARMC)
CK, Total: 66 U/L (ref 35–232)
CK-MB: 1.2 ng/mL (ref 0.5–3.6)

## 2012-07-29 LAB — URINALYSIS, COMPLETE
Bacteria: NONE SEEN
Glucose,UR: NEGATIVE mg/dL (ref 0–75)
Ketone: NEGATIVE
Ph: 5 (ref 4.5–8.0)
Protein: 100
RBC,UR: <1 /HPF (ref 0–5)
Squamous Epithelial: <1

## 2012-07-29 LAB — CBC
MCH: 28.7 pg (ref 26.0–34.0)
MCHC: 31.5 g/dL — ABNORMAL LOW (ref 32.0–36.0)
MCV: 91 fL (ref 80–100)
RDW: 16.4 % — ABNORMAL HIGH (ref 11.5–14.5)
WBC: 7.8 10*3/uL (ref 3.8–10.6)

## 2012-07-30 LAB — CBC WITH DIFFERENTIAL/PLATELET
Basophil #: 0 10*3/uL (ref 0.0–0.1)
Basophil %: 0.8 %
Eosinophil #: 0 10*3/uL (ref 0.0–0.7)
HCT: 31.1 % — ABNORMAL LOW (ref 40.0–52.0)
Lymphocyte #: 0.6 10*3/uL — ABNORMAL LOW (ref 1.0–3.6)
MCH: 28.9 pg (ref 26.0–34.0)
MCV: 92 fL (ref 80–100)
Monocyte #: 0.4 x10 3/mm (ref 0.2–1.0)
Monocyte %: 12.2 %
Neutrophil #: 2.1 10*3/uL (ref 1.4–6.5)
Neutrophil %: 68.2 %
Platelet: 249 10*3/uL (ref 150–440)
RBC: 3.4 10*6/uL — ABNORMAL LOW (ref 4.40–5.90)
WBC: 3 10*3/uL — ABNORMAL LOW (ref 3.8–10.6)

## 2012-07-30 LAB — BASIC METABOLIC PANEL
Anion Gap: 8 (ref 7–16)
BUN: 59 mg/dL — ABNORMAL HIGH (ref 7–18)
Calcium, Total: 7.9 mg/dL — ABNORMAL LOW (ref 8.5–10.1)
Chloride: 104 mmol/L (ref 98–107)
Co2: 24 mmol/L (ref 21–32)
EGFR (African American): 7 — ABNORMAL LOW
Glucose: 118 mg/dL — ABNORMAL HIGH (ref 65–99)
Potassium: 5.8 mmol/L — ABNORMAL HIGH (ref 3.5–5.1)
Sodium: 136 mmol/L (ref 136–145)

## 2012-07-31 ENCOUNTER — Inpatient Hospital Stay: Payer: Self-pay | Admitting: Family Medicine

## 2012-07-31 LAB — BASIC METABOLIC PANEL
Anion Gap: 5 — ABNORMAL LOW (ref 7–16)
Calcium, Total: 6.9 mg/dL — CL (ref 8.5–10.1)
Co2: 31 mmol/L (ref 21–32)
Creatinine: 5.17 mg/dL — ABNORMAL HIGH (ref 0.60–1.30)
EGFR (African American): 11 — ABNORMAL LOW
EGFR (Non-African Amer.): 10 — ABNORMAL LOW
Glucose: 170 mg/dL — ABNORMAL HIGH (ref 65–99)
Osmolality: 282 (ref 275–301)
Potassium: 4.6 mmol/L (ref 3.5–5.1)
Sodium: 134 mmol/L — ABNORMAL LOW (ref 136–145)

## 2012-08-01 LAB — BASIC METABOLIC PANEL
Anion Gap: 4 — ABNORMAL LOW (ref 7–16)
Calcium, Total: 7.3 mg/dL — ABNORMAL LOW (ref 8.5–10.1)
Creatinine: 5.99 mg/dL — ABNORMAL HIGH (ref 0.60–1.30)
EGFR (African American): 9 — ABNORMAL LOW
Osmolality: 283 (ref 275–301)
Potassium: 5.2 mmol/L — ABNORMAL HIGH (ref 3.5–5.1)

## 2012-08-01 LAB — CBC WITH DIFFERENTIAL/PLATELET
Basophil #: 0 10*3/uL (ref 0.0–0.1)
Basophil %: 0.1 %
Eosinophil #: 0 10*3/uL (ref 0.0–0.7)
Eosinophil %: 0 %
HCT: 30.2 % — ABNORMAL LOW (ref 40.0–52.0)
HGB: 9.5 g/dL — ABNORMAL LOW (ref 13.0–18.0)
Lymphocyte #: 0.2 10*3/uL — ABNORMAL LOW (ref 1.0–3.6)
Lymphocyte %: 3.9 %
MCH: 29 pg (ref 26.0–34.0)
Monocyte %: 6.6 %
Neutrophil #: 5.4 10*3/uL (ref 1.4–6.5)
Neutrophil %: 89.4 %
RBC: 3.29 10*6/uL — ABNORMAL LOW (ref 4.40–5.90)

## 2012-08-03 LAB — BASIC METABOLIC PANEL
BUN: 57 mg/dL — ABNORMAL HIGH (ref 7–18)
Calcium, Total: 7.7 mg/dL — ABNORMAL LOW (ref 8.5–10.1)
Co2: 32 mmol/L (ref 21–32)
EGFR (Non-African Amer.): 9 — ABNORMAL LOW
Glucose: 170 mg/dL — ABNORMAL HIGH (ref 65–99)
Osmolality: 288 (ref 275–301)
Potassium: 3.8 mmol/L (ref 3.5–5.1)
Sodium: 134 mmol/L — ABNORMAL LOW (ref 136–145)

## 2012-08-03 LAB — PHOSPHORUS: Phosphorus: 3.2 mg/dL (ref 2.5–4.9)

## 2012-08-06 LAB — CBC WITH DIFFERENTIAL/PLATELET
Basophil %: 0.2 %
Eosinophil #: 0 10*3/uL (ref 0.0–0.7)
Eosinophil %: 0 %
HCT: 30.7 % — ABNORMAL LOW (ref 40.0–52.0)
HGB: 9.8 g/dL — ABNORMAL LOW (ref 13.0–18.0)
Lymphocyte #: 0.2 10*3/uL — ABNORMAL LOW (ref 1.0–3.6)
MCH: 29.1 pg (ref 26.0–34.0)
MCHC: 31.8 g/dL — ABNORMAL LOW (ref 32.0–36.0)
Neutrophil #: 7.9 10*3/uL — ABNORMAL HIGH (ref 1.4–6.5)
Neutrophil %: 90.6 %
Platelet: 152 10*3/uL (ref 150–440)
RBC: 3.35 10*6/uL — ABNORMAL LOW (ref 4.40–5.90)
RDW: 16.3 % — ABNORMAL HIGH (ref 11.5–14.5)

## 2012-08-06 LAB — BASIC METABOLIC PANEL
Anion Gap: 9 (ref 7–16)
BUN: 39 mg/dL — ABNORMAL HIGH (ref 7–18)
Calcium, Total: 8.2 mg/dL — ABNORMAL LOW (ref 8.5–10.1)
Co2: 29 mmol/L (ref 21–32)
Glucose: 111 mg/dL — ABNORMAL HIGH (ref 65–99)
Potassium: 3.7 mmol/L (ref 3.5–5.1)
Sodium: 135 mmol/L — ABNORMAL LOW (ref 136–145)

## 2012-08-10 ENCOUNTER — Institutional Professional Consult (permissible substitution): Admission: AD | Admit: 2012-08-10 | Payer: Self-pay | Source: Ambulatory Visit | Admitting: Internal Medicine

## 2012-08-29 ENCOUNTER — Ambulatory Visit: Payer: Self-pay | Admitting: Internal Medicine

## 2012-08-29 DEATH — deceased

## 2013-04-02 IMAGING — XA IR VASCULAR PROCEDURE
1 series · 2 of 2 positions shown · IV contrast (IODINE)
Comparison: none

[Series 1: care upper arm · 2 of 2 slices shown]
[im 1/2]
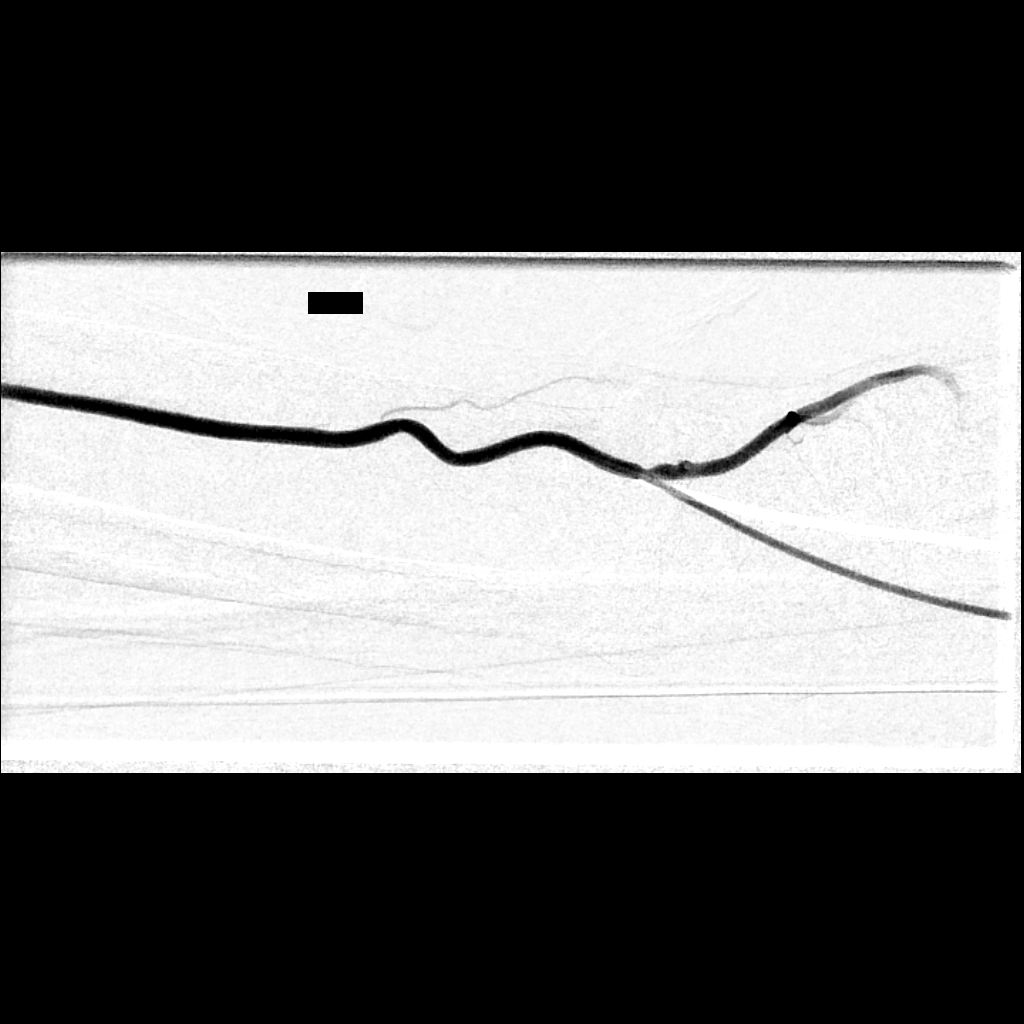
[im 2/2]
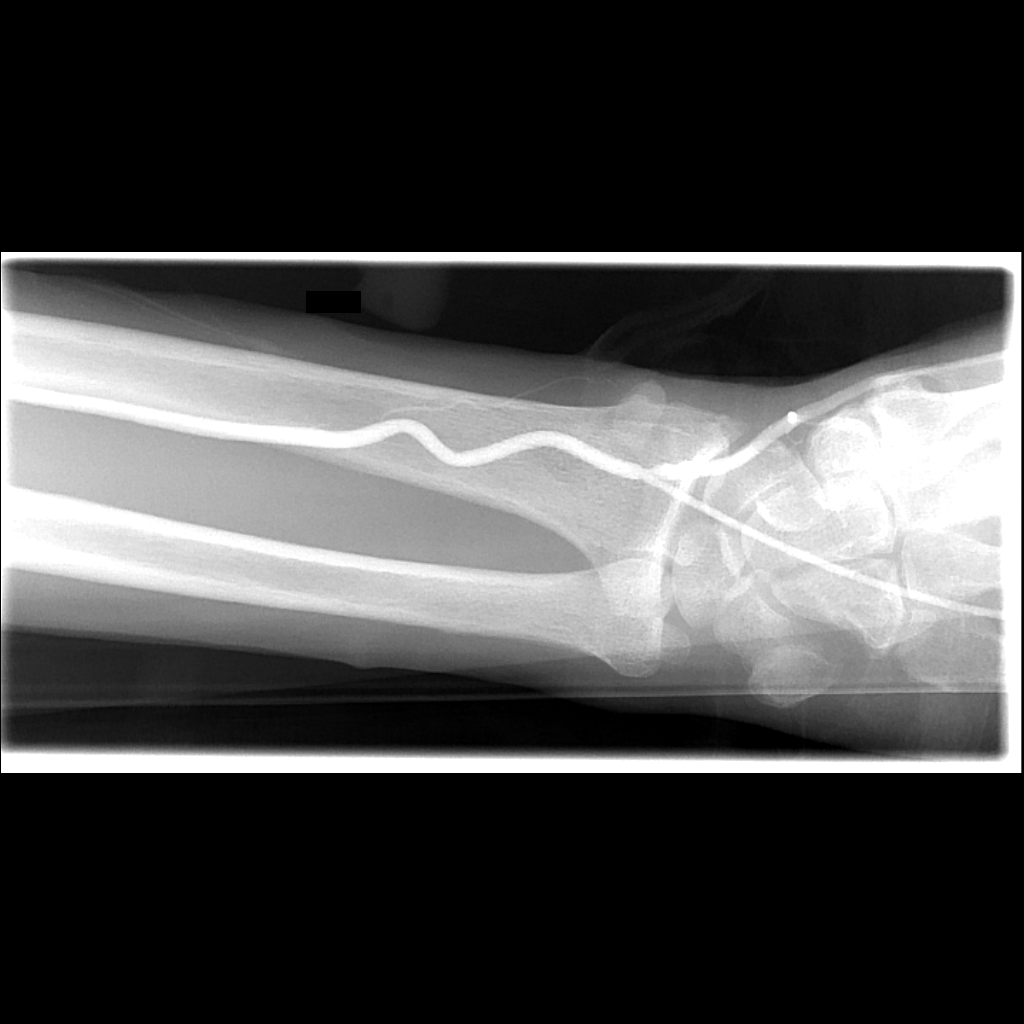

[2 of 2 positions shown; findings below may reference images not displayed]

IMAGES IMPORTED FROM THE SYNGO WORKFLOW SYSTEM
NO DICTATION FOR STUDY

## 2014-02-21 IMAGING — CR DG CHEST 2V
1 series · 3 of 3 positions shown · non-contrast
Comparison: none

REASON FOR EXAM: sob
COMMENTS:

PROCEDURE:     DXR - DXR CHEST PA (OR AP) AND LATERAL  - July 29, 2012 [DATE]
RESULT:     Comparison: 04/12/2012

[Series 1: pa · 0.17mm/px · 3 of 3 slices shown]
[im 1/3]
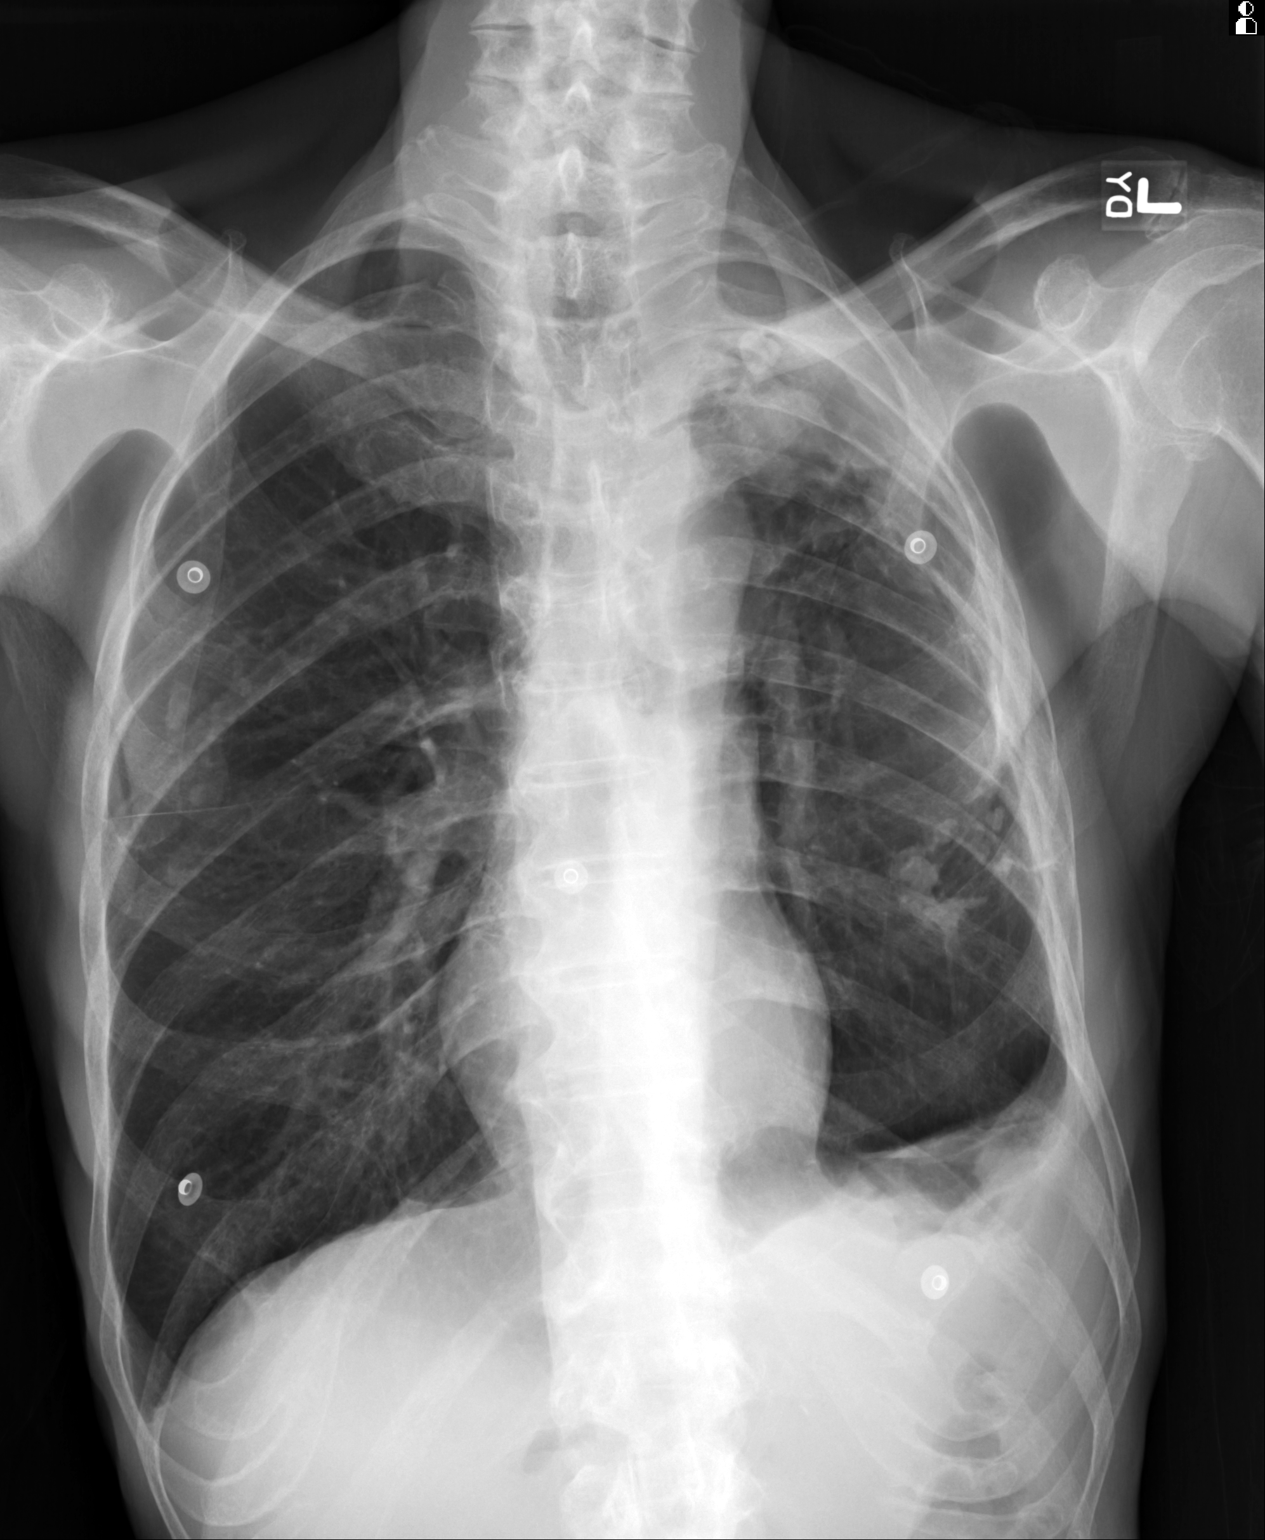
[im 2/3]
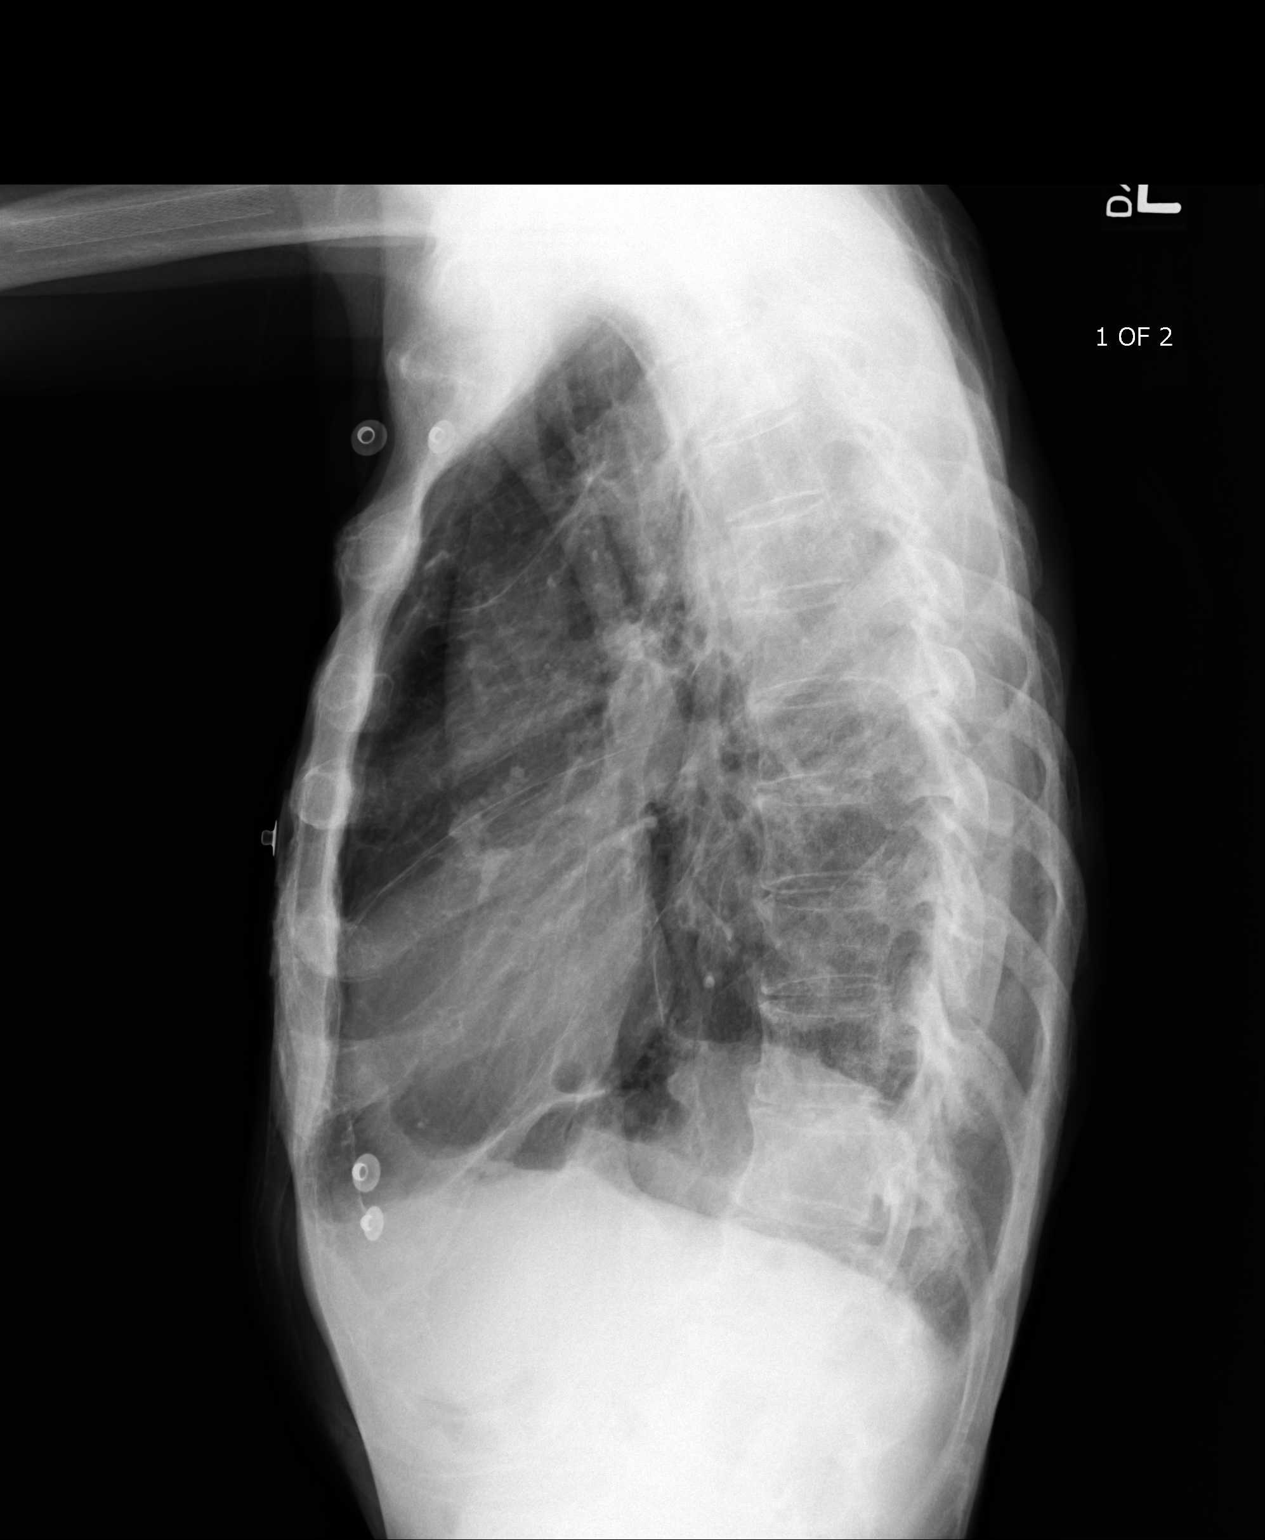
[im 3/3]
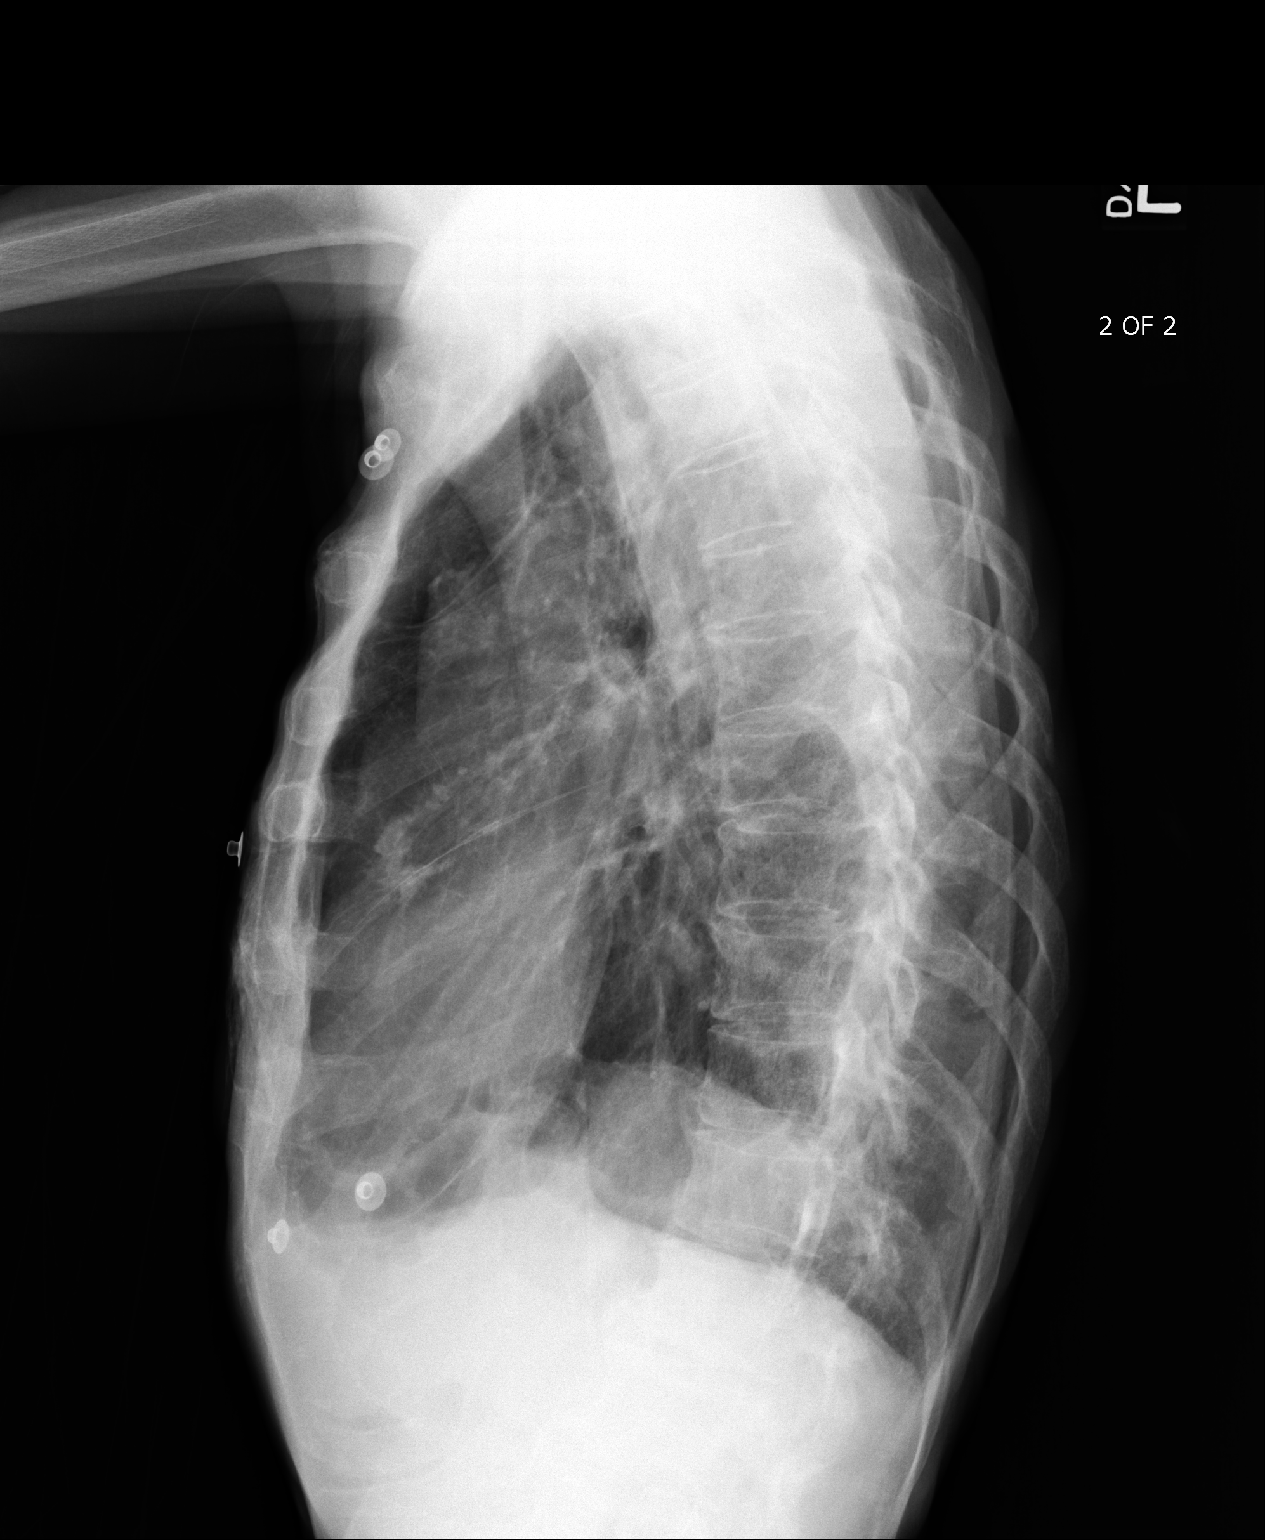

[3 of 3 positions shown; findings below may reference images not displayed]

FINDINGS: PA and lateral chest radiographs are provided. There is left lung volume
loss. There is a cavitary area in the left lung apex which is unchanged from
the prior examination of 04/12/2012. There is a calcified pleural plaque.
There is calcified right pleural plaque. There is chronic blunting of the
left costophrenic angle. No pneumothorax. The heart and mediastinum are
unremarkable.  The osseous structures are unremarkable.
IMPRESSION: No acute disease of the che[REDACTED]

## 2014-02-21 IMAGING — CT CT CHEST W/O CM
1 series · 15 of 33 positions shown, 19 images · non-contrast
Comparison: 04/12/2012

REASON FOR EXAM: left lung mass, weight loss, failure to thrive, COPD
COMMENTS:

PROCEDURE:     CT  - CT CHEST WITHOUT CONTRAST  - July 29, 2012  [DATE]
RESULT:     Indication: Left lung mass
TECHNIQUE: Multiple axial images of the chest are obtained without
intravenous contrast.

[Series 2: soft tissue · axial · 0.77mm/px · z∈[-594,-290]mm · 15 of 119 slices shown, 19 images]
[im 9/119  mediastinal]
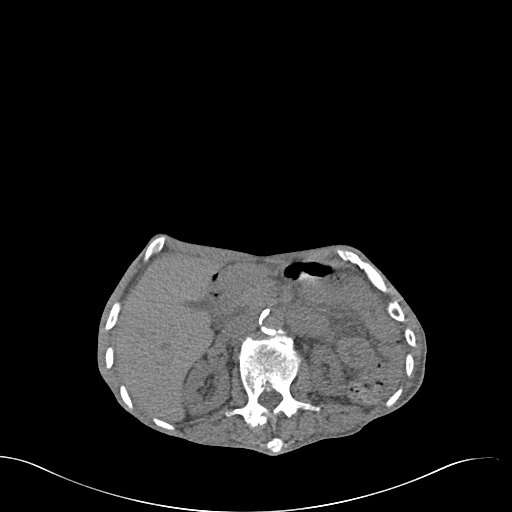
[im 9/119  lung]
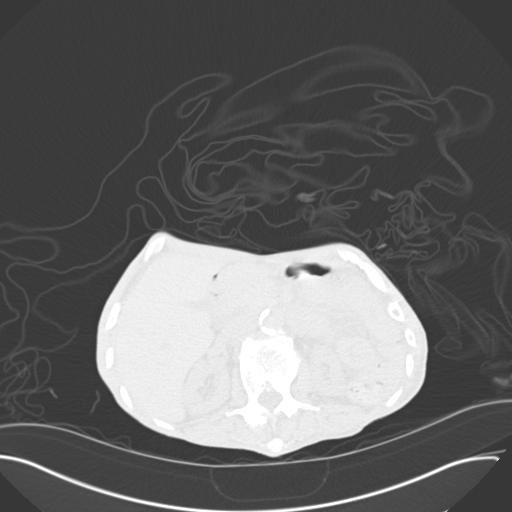
[im 18/119  lung]
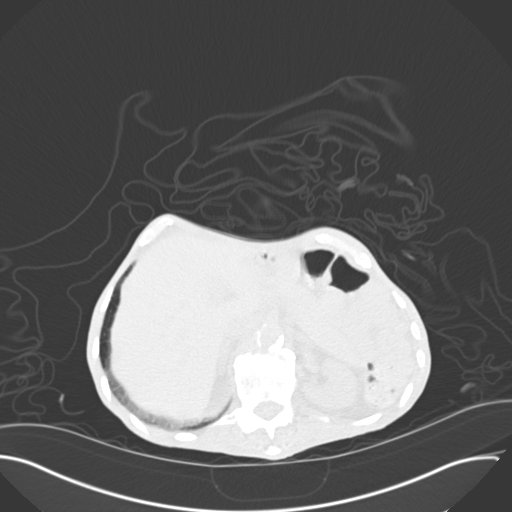
[im 24/119  lung]
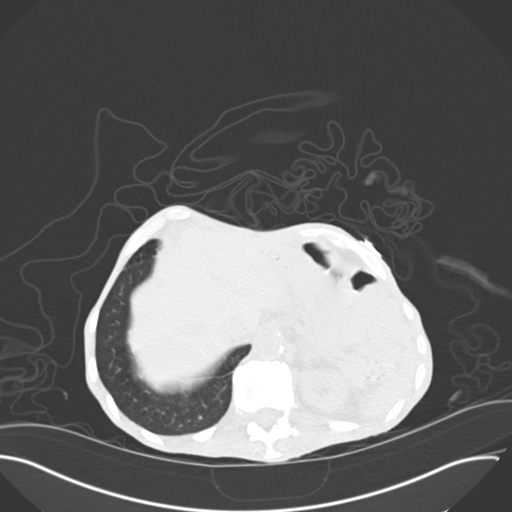
[im 31/119  lung]
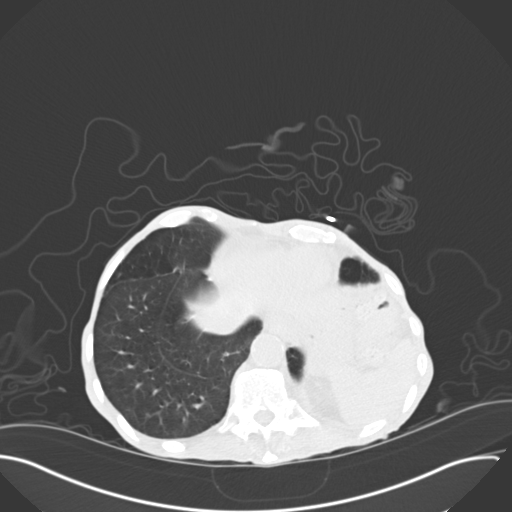
[im 40/119  mediastinal]
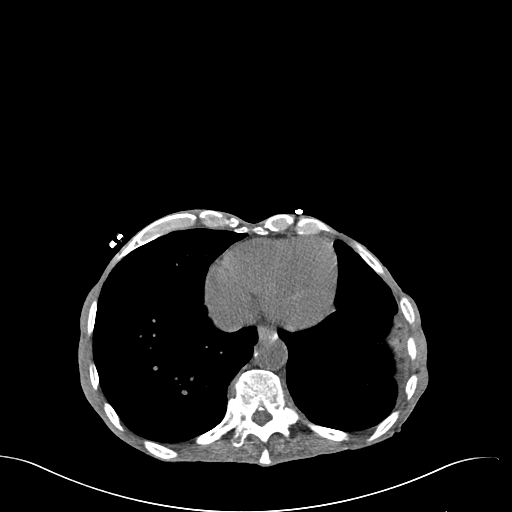
[im 40/119  lung]
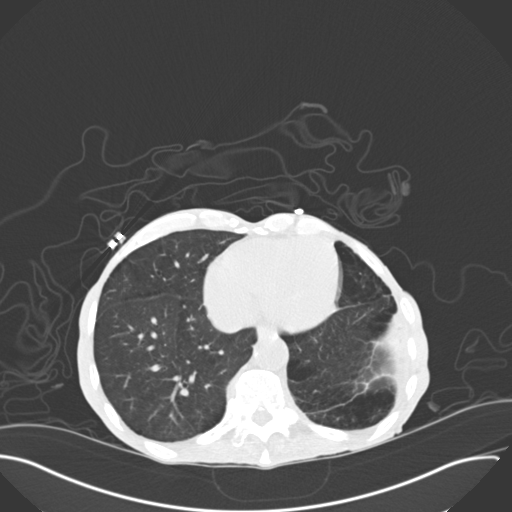
[im 48/119  lung]
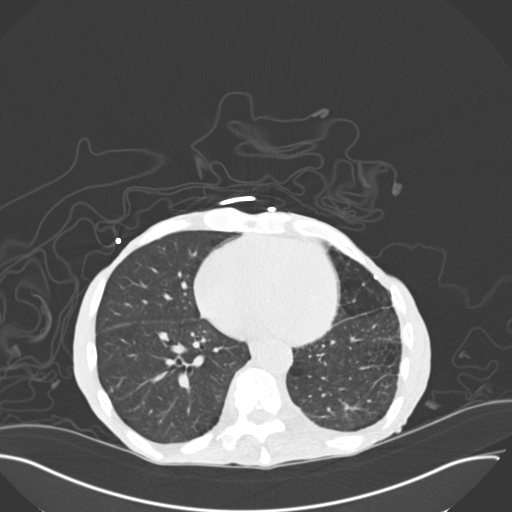
[im 53/119  lung]
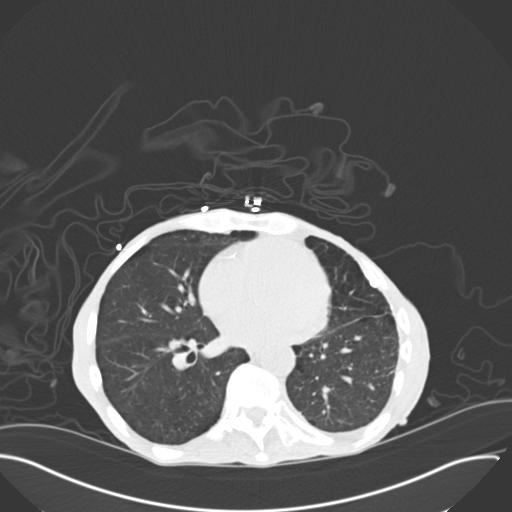
[im 62/119  lung]
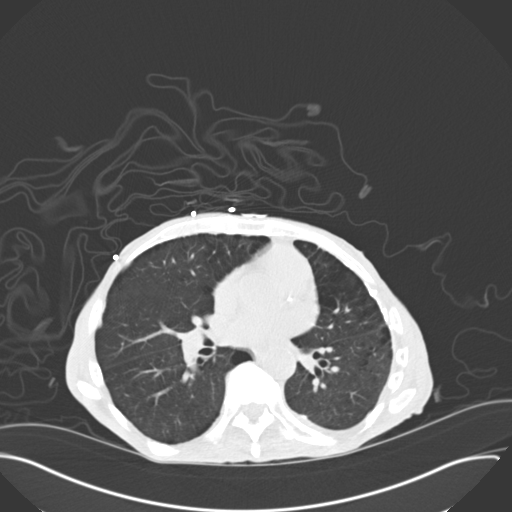
[im 66/119  mediastinal]
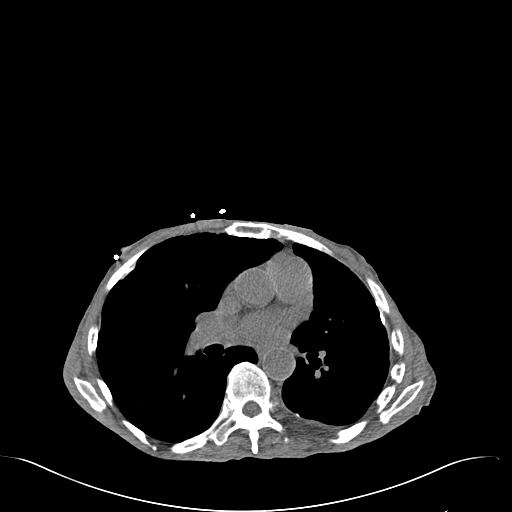
[im 66/119  lung]
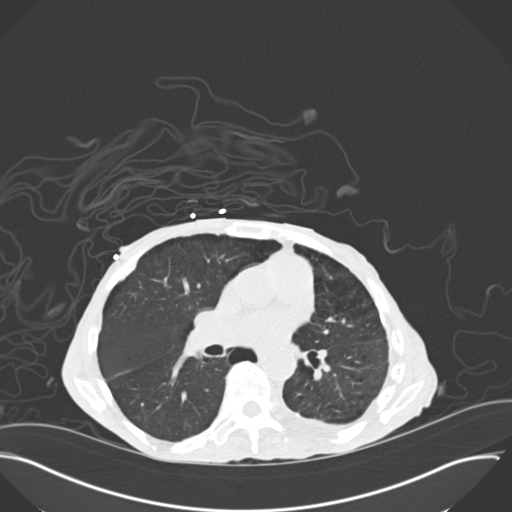
[im 71/119  lung]
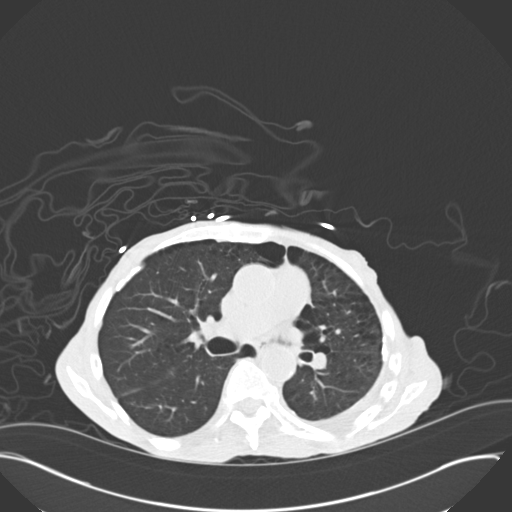
[im 79/119  lung]
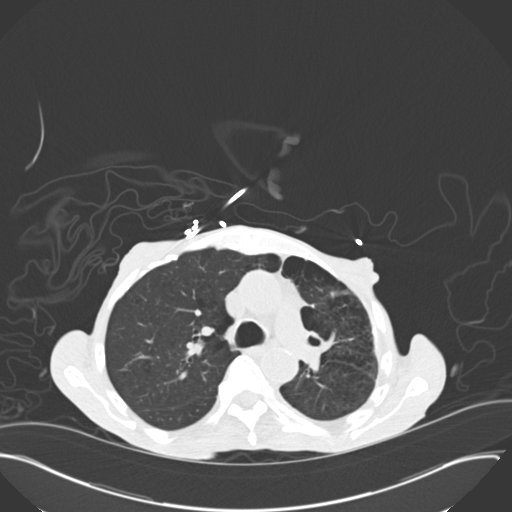
[im 88/119  lung]
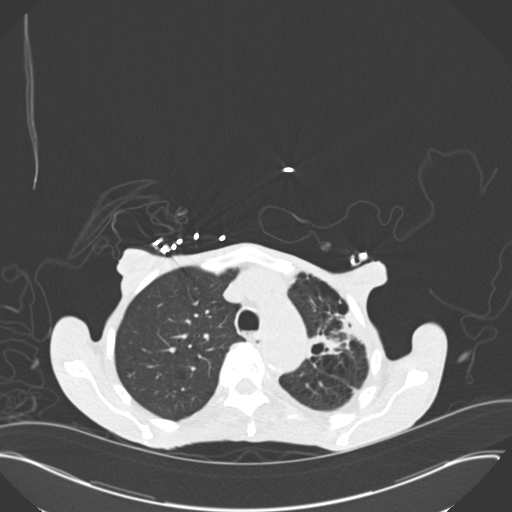
[im 95/119  mediastinal]
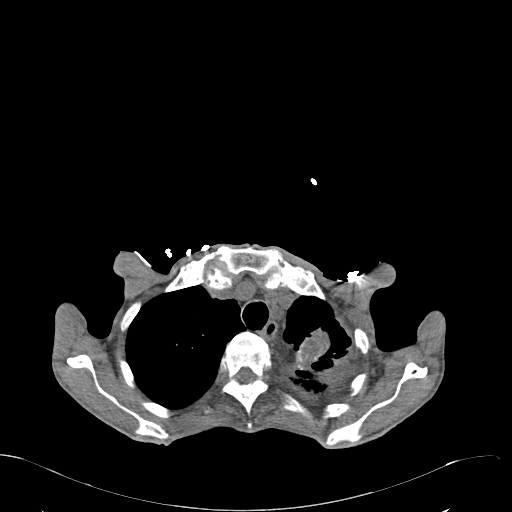
[im 95/119  lung]
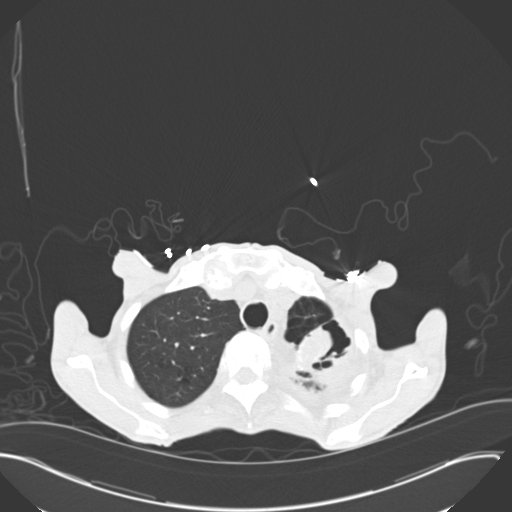
[im 101/119  lung]
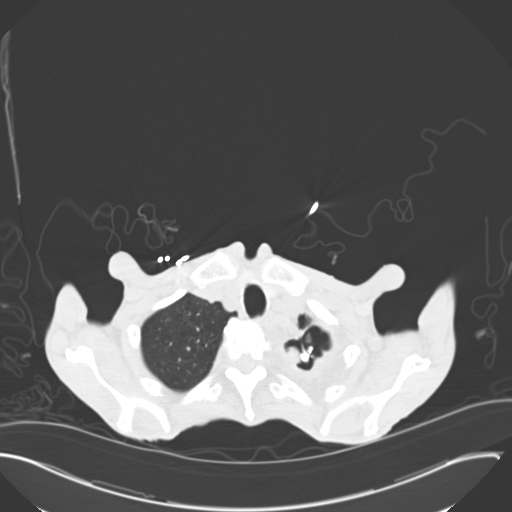
[im 110/119  lung]
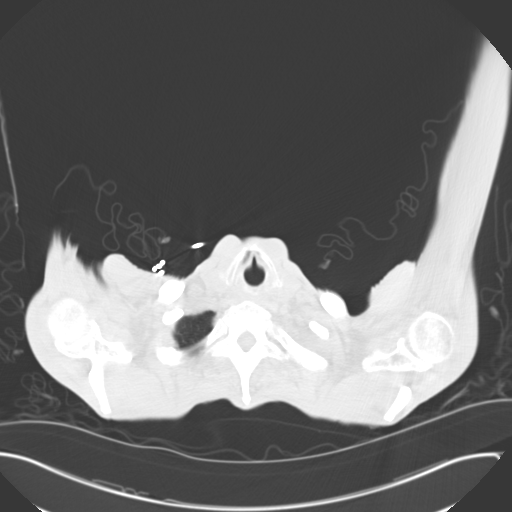

[15 of 33 positions shown; findings below may reference images not displayed]

FINDINGS: The central airways are patent. There is a 4.6 x 3.8 cm left apical cavity
with a 3.2 cm masslike area within the cavity which is unchanged compared
with the prior examination and may represent a fungus ball versus
tuberculosis. Alternatively malignancy cannot be excluded. There is
associated coarse calcification. There is associated left apical airspace
disease. There is chronic blunting of the left cost phrenic angle. There are
bilateral emphysematous changes, left greater than right. There is no
pleural effusion or pneumothorax. There are bilateral calcified pleural
plaques.

There are no pathologically enlarged axillary, hilar, or mediastinal lymph
nodes.

The heart size is normal. There is no pericardial effusion. The thoracic
aorta is normal in caliber.

Review of bone windows demonstrates no focal lytic or sclerotic lesions.

Limited noncontrast images of the upper abdomen were obtained. The adrenal
glands appear normal. The remainder of the visualized abdominal organs are
unremarkable.
IMPRESSION: 1. Stable left apical cavity with a soft tissue mass within the cavity which
may represent a fungus ball or tuberculosis. There is adjacent
bronchiectasis and airspace disease. The overall appearance is unchanged
compared with 04/12/2012.

[REDACTED]

## 2014-09-17 NOTE — Consult Note (Signed)
PATIENT NAME:  [name], [name]:  [id number] DATE OF BIRTH:  1928-05-01  DATE OF CONSULTATION:  01/04/2012  REFERRING PHYSICIAN:   CONSULTING PHYSICIAN:  Keturah Barrehristiane H. [place], NP  PRIMARY CARE PHYSICIAN: Beverely RisenFozia Khan, MD  PULMONOLOGIST: Freda MunroSaadat Khan, MD  HISTORY OF PRESENT ILLNESS: Mr. [name] is a pleasant 81 year old African American man with a history of chronic kidney disease stage V awaiting initiation of dialysis, hypertension, chronic obstructive pulmonary disease, and right upper lobe lung mass. He has undergone recent bronchoscopy and has been treated for a Klebsiella infection in the lungs. Gastroenterology has been consulted at the request of Dr. Enid Baasadhika Kalisetti to evaluate dyspepsia, diarrhea, and bloating. The patient reports that these started after he began some antibiotics. Evidently he was started on Levaquin last Wednesday due to some Klebsiella infection in his lungs. Soon after that he started to have nausea, vomiting, cramping, and diarrhea. He is no longer on this medication, but has been on Flagyl. He has had stool testing that was negative for C. difficile, negative culture, and negative white blood cells. He has been on Flagyl up until today, which has been discontinued after ID evaluation. The patient states currently he is feeling much better. Nausea and vomiting and dyspepsia stopped prior to today and states he has only had three loose stools today in comparison to multiple more frequent stools in days past. Denies abdominal pain and fullness at present. He states he does not feel bloated. He did undergo colonoscopy in 2009 that revealed one small polyp. Additionally he denies bloody stools, black stools, and tarry stools, just states his stools have been loose and brown.   PAST MEDICAL HISTORY:  1. Chronic obstructive pulmonary disease.  2. Hypertension.  3. Gastroesophageal reflux disease. 4. Prosthetic hypertrophy, benign. 5. Chronic kidney disease, stage  V. 6. Right lung mass. 7. History of active tuberculosis.   ALLERGIES: Sulfa, full dose aspirin, and iodinated contrast dye.  SOCIAL HISTORY: Former smoker. No EtOH or illicits. Lives with niece.   FAMILY HISTORY: No coronary artery disease, diabetes, or high blood pressure.   OUTPATIENT MEDICATIONS: 1. Advair 250/50. 2. Amlodipine 10 mg p.o. daily.  3. Aspirin 81 mg p.o. daily. 4. Fexofenadine 60 mg three tabs once daily.  5. Sodium bicarbonate 650 mg 1 tab twice a day.  REVIEW OF SYSTEMS: A 10-point review is unremarkable with the exception that the patient has had recent bronchoscopy regarding right upper lung mass and pulmonary infections. He is having a respiratory treatment at present. He does have history of chronic obstructive pulmonary disease. He states he is not short of breath at this moment. He is very thin, but evidently his weight has been stable.  MOST RECENT LABS: Glucose 93, BUN 55, creatinine 6.15, sodium 135, potassium 3.8, GFR 9, calcium 7.2, phosphorus 4.8, total protein 7.1, albumin 2.4, total bilirubin 0.4, alkaline phosphatase 96, AST 18, and ALT 8. WBC 7.2, hemoglobin 10.8, hematocrit 34.6, and platelet count 236. He does have microcytic anemia.   Stool studies are negative for WBC, RBC, and Clostridium difficile. Stool culture was negative for pathogens.   PHYSICAL EXAMINATION:   VITAL SIGNS: Temperature 98.3, pulse 84, respiratory rate 18, blood pressure 149/68, and Sa02 98%.   GENERAL: Chronically ill appearing, thin African American male, pleasant, in no obvious distress.   PSYCHIATRIC: Cooperative, cheerful, logical thought, and good memory.   HEENT: Normocephalic, atraumatic. No redness, drainage, or inflammation to the eyes or nares. Oral mucous membranes are pink and moist.  NECK: Supple. No thyromegaly, adenopathy.   CARDIOVASCULAR: S1 and S2, regular rate and rhythm. No MRG. Peripheral pulses 2+. No edema.   LUNGS: Currently with a breathing  treatment in progress. Mild coarseness throughout. No increased work of breathing. Speaks in complete sentences.   ABDOMEN: Flat. Active bowel sounds x4. Soft, nontender, and nondistended. No hepatosplenomegaly, masses, peritoneal signs, or tenderness. No guarding.   RECTAL: Deferred.   GU: Deferred.   EXTREMITIES: Moves all extremities x4. Gait steady. Sensation intact. No clubbing or cyanosis.   SKIN: No erythema, lesion, or rash.   NEUROLOGICAL: Cranial nerves II through XII grossly intact. Speech clear. No facial droop. Strength 5/5 in all extremities.   IMPRESSION AND RECOMMENDATIONS: Nausea, vomiting, and diarrhea likely antibiotic side effects. Stool test negative. Feeling better. We will continue to observe and see how he does.   These services were provided by Vevelyn Pat, MSN, NPC in collaboration with Barnetta Chapel, MD with whom I have discussed this patient in full. ____________________________ Keturah Barre, NP chl:slb D: 01/05/2012 14:45:00 ET     T: 01/05/2012 15:15:25 ET         JOB#: [id number] cc: Keturah Barre, NP, <Dictator> Eustaquio Maize LONDON FNP ELECTRONICALLY SIGNED 01/06/2012 7:05

## 2014-09-17 NOTE — H&P (Signed)
PATIENT NAME:  [name], [name] MR#:  [id number] DATE OF BIRTH:  1927/09/12  DATE OF ADMISSION:  03/19/2012  PRIMARY CARE PHYSICIAN: Dr. Freda Munro    REFERRING PHYSICIAN: Dr. Zenda Alpers    CHIEF COMPLAINT: Dyspnea and generalized weakness.   HISTORY OF PRESENT ILLNESS: Mr. Dierks is an 81 year old African American male with history of end-stage renal disease on hemodialysis on Tuesday, Thursday, and Saturday. He had his dialysis on Saturday. He states that even prior to that he had feeling of generalized weakness associated with shortness of breath. This had exacerbated after dialysis. The patient was brought to the Emergency Department for evaluation and nothing was revealing other than new finding of right pleural effusion. He was also vague to his symptoms and at one point he had some vague abdominal pain. He ended by having CAT scan of the abdomen which showed mesenteric edema, anasarca, and right pleural effusion in addition to his emphysema. CAT scan of the chest without contrast also revealed right pleural effusion with adjacent atelectasis, chronic lung disease, and left apical pleural thickening. The patient was admitted for further evaluation and treatment.   REVIEW OF SYSTEMS: CONSTITUTIONAL: Denies having any fever. No chills. No night sweats but reports recent fatigue. EYES: No blurring of vision. No double vision. ENT: No hearing impairment. No sore throat. No dysphagia. CARDIOVASCULAR: No chest pain but reports shortness of breath. No edema. No syncope. RESPIRATORY: No chest pain but he has shortness of breath. No cough. No hemoptysis. GASTROINTESTINAL: Vague abdominal pain. No nausea. No vomiting. No diarrhea. No hematochezia. No melena. GENITOURINARY: No dysuria. No frequency of urination. MUSCULOSKELETAL: No joint pain. No swelling. No muscular pain or swelling. INTEGUMENTARY: No skin rash. No ulcers. NEUROLOGY: No focal weakness. No seizure activity. No headache. PSYCHIATRY: No anxiety.  No depression. ENDOCRINE: No polyuria or polydipsia. No heat or cold intolerance.   PAST MEDICAL HISTORY:  1. End-stage renal disease on hemodialysis on Tuesday, Thursday, and Saturday. 2. Chronic obstructive pulmonary disease.  3. Systemic hypertension.  4. Gastroesophageal reflux disease.  5. Benign prostatic hypertrophy. 6. Anemia of chronic disease.  7. Left lung apical mass versus scarring. This had been resolved recently. About three weeks ago he had biopsy at [facility] with results showing scar tissue.   8. Remote history of tuberculosis.  9. History of secondary hyperparathyroidism.  10. History of irritable bowel syndrome.   PAST SURGICAL HISTORY:  1. AV fistula formation. 2. Recent left lung apical biopsy.   SOCIAL HABITS: Ex-chronic smoker. No history of alcohol or drug abuse.   FAMILY HISTORY: The patient reports that both parents died of natural causes. He has one sister who is healthy. No family history of end-stage renal disease. No family history of premature coronary disease, diabetes, or cancer.   SOCIAL HISTORY: The patient lives at home, cared for by his niece.   ADMISSION MEDICATIONS:  1. Megace 400 mg once a day. 2. Simethicone 80 mg 4 times a day.  3. Maalox p.r.n.  4. Fexofenadine 60 mg 3 tablets once a day. 5. Combivent Respimat one puff 4 times a day. 6. Carvedilol 12.5 mg once a day.  7. Advair Diskus 250/50 two puffs twice a day.   ALLERGIES: Sulfa and aspirin both cause GI discomfort. Also reported in the past that he has allergy to iodinated contrast.   PHYSICAL EXAMINATION:   VITAL SIGNS: Blood pressure 219/86, respiratory rate 20, pulse 88, temperature 97.9, oxygen saturation 100%.   GENERAL APPEARANCE: Elderly male cachectic in  no acute distress.   HEAD: No pallor. No icterus. No cyanosis.   EARS, NOSE, AND THROAT: Hearing was normal. Nasal mucosa, lips, tongue were normal.   EYES: Normal iris and conjunctivae. Pupils about 3 mm, equal,  could not detect significant reaction to light.   NECK: Supple. Trachea at midline. No thyromegaly. No cervical lymphadenopathy.   HEART: Normal S1, S2. No S3, S4. No murmur. No gallop. No carotid bruits.   RESPIRATORY: Normal breathing pattern at time of my examination. No rales. No wheezing.   ABDOMEN: Soft without tenderness. No hepatosplenomegaly. No masses. No hernias.   SKIN: No ulcers. No subcutaneous nodules.   MUSCULOSKELETAL: No joint swelling. No clubbing.   NEUROLOGIC: Cranial nerves II through XII are intact. No focal motor deficit.   PSYCHIATRY: The patient is alert and oriented x3. Mood and affect were normal.   LABORATORY, DIAGNOSTIC, AND RADIOLOGICAL DATA: EKG showed normal sinus rhythm at rate of 73 per minute, left anterior hemiblock, old anteroseptal myocardial infarction. His EKG is unchanged compared to his last EKG.  CAT scan of the chest showed right pleural effusion with adjacent atelectasis. Chronic lung disease with volume loss of the left hemithorax. Left apical pleural thickening.   CAT scan of the abdomen showed cachexia. Mesenteric edema and anasarca. Right pleural effusion. Emphysema.   His blood work-up showed glucose of 68, BUN 12, creatinine 2.2, sodium 134, potassium 3.7, calcium 8. Liver function tests showed albumin of 2.8, otherwise the rest of the tests were normal. Troponin was 0.03. CBC showed white count of 8000, hemoglobin 12, hematocrit 37, platelet count 160. His baseline hemoglobin is 11.8 a month ago.   ASSESSMENT:  1. Generalized weakness, unclear etiology. 2. Shortness of breath.  3. New finding of right pleural effusion. 4. Mesenteric edema and anasarca.  5. Chronic obstructive pulmonary disease.  6. Severe systemic hypertension.  7. End-stage renal disease, stage V, on hemodialysis.   OTHER MEDICAL PROBLEMS:  1. Left lung apical scar tissue proven by biopsy [facility] three weeks ago [facility].  2. Anemia of chronic  disease. 3. Secondary hyperparathyroidism. 4. Old history of tuberculosis.  5. History of benign prostatic hypertrophy.  6. Gastroesophageal reflux disease.  7. History of irritable bowel syndrome.   PLAN:  1. Will obtain arterial blood gas.  2. Schedule the patient to have V/Q scan in the morning.  3. Pulmonary consult.  4. Obtain echocardiogram to assess cardiac function and also to rule out left ventricular dysfunction as a cause for the pleural effusion.  5. Oxygen supplementation.  6. DuoNebs q.4 hours while awake p.r.n. The patient's blood pressure is uncontrolled and needs further attention. I ordered hydralazine 10 mg to be given IV now and will follow-up on that.   The patient indicates that he has no LIVING WILL and his CODE STATUS is FULL CODE.  TIME SPENT: Time Spent evaluating this patient and reviewing medical records took more than one hour.    ____________________________ Carney CornersAmir M. Rudene Rearwish, MD amd:drc D: 03/19/2012 04:30:23 ET T: 03/19/2012 10:40:29 ET JOB#: [id number]  cc: Carney CornersAmir M. Rudene Rearwish, MD, <Dictator>, Yevonne PaxSaadat A. Khan, MD Karolee OhsAMIR Dala DockM Monzerrath Mcburney MD ELECTRONICALLY SIGNED 03/19/2012 22:35

## 2014-09-17 NOTE — Consult Note (Signed)
Brief Consult Note: Diagnosis: end stage renal disease.   Orders entered.   Discussed with Attending MD.   Comments: will plan permacath on August 7th.  Possible active TB infection is complicating things particularly timing of the case and it may need to be the last case to allow for terminal cleaning after the procedure.  Electronic Signatures: Levora DredgeSchnier, Abdulwahab Demelo (MD)  (Signed 06-Aug-13 17:44)  Authored: Brief Consult Note   Last Updated: 06-Aug-13 17:44 by Levora DredgeSchnier, Janann Boeve (MD)

## 2014-09-17 NOTE — Discharge Summary (Signed)
PATIENT NAME:  [name], [name] MR#:  [id number] DATE OF BIRTH:  1927/11/04  DATE OF ADMISSION:  04/12/2012 DATE OF DISCHARGE:  04/14/2012  For a detailed note, please take a look at the history and physical done on admission by Dr. Mordecai MaesSanchez.   DIAGNOSES AT DISCHARGE:  1. Generalized weakness, likely secondary to deconditioning and underlying severe chronic obstructive pulmonary disease. 2. Chronic obstructive pulmonary disease.  3. End-stage renal disease on hemodialysis.  4. Depression.  5. Adult failure to thrive.   DIET: Patient is being discharged on a regular diet.   ACTIVITY: As tolerated.   FOLLOW UP: Follow up with Dr. Beverely RisenFozia Khan in the next 1 to 2 weeks.   DISCHARGE MEDICATIONS: 1. Advair 250/50, 2 puffs b.i.d.  2. Megace 400 mg daily.  3. Combivent Respimat 1 puff q.i.d. as needed. 4. Simethicone 80 mg q.i.d. as needed.  5. Losartan 100 mg at bedtime.  6. Albuterol ipratropium nebulizer 4 times daily as needed.  7. Coreg 12.5 mg b.i.d.  8. Zoloft 25 mg daily.   CONSULTANT DURING THE HOSPITAL COURSE: Dr. Mady HaagensenMunsoor Lateef from nephrology.   LABORATORY, DIAGNOSTIC AND RADIOLOGICAL DATA: Pertinent studies done during the hospital course: CT scan of the chest done without contrast on admission showing persistent pleural parenchymal thickening, mass lesion cavitation left upper lobe which could be infectious or malignant. There are persistent changes of bronchiectasis. A chest x-ray done showing left lung apical mass versus pneumonia, area of fibrosis in consideration, more prominent demonstrated compared to the previous study, possibly because of differences in technique. Appearance is more similar to that of 12/17/2011.   BRIEF HOSPITAL COURSE: This is an 81 year old male with medical problems as mentioned above presented to the hospital due to generalized weakness and also noted to be hypoxemic.  1. Generalized weakness. Most likely this was secondary to deconditioning with  underlying severe chronic obstructive pulmonary disease. Patient was evaluated by physical therapy here in the hospital, thought that he would benefit from home health PT services but the patient does not want that. He has been able to ambulate well with the help of physical therapy and therefore is being discharged home presently. Likely the cause of his weakness is deconditioning with chronic obstructive pulmonary disease and malnutrition. He is on Megace supplements, he will continue and will continue a nephro-carb bolus nutritional supplement b.i.d. at home.  2. Chronic obstructive pulmonary disease. He did not have any evidence of acute chronic obstructive pulmonary disease exacerbation. He will continue his Advair and his p.r.n. nebulizer treatments. We attempted to do ambulatory oximetry on him, although did not qualify him for home oxygen we will try to arrange overnight oximetry to see if he would benefit from home oxygen.  3. Adult failure to thrive with malnutrition. As mentioned patient is already on Megace. He will continue that. He was started on a nephro-carb oral supplement which he will also continue upon discharge.  4. Depression. Patient was maintained on Zoloft, he will resume that.  5. End-stage renal disease on hemodialysis. Patient did get dialysis yesterday, tolerated it well. He will resume his dialysis on Tuesday, Thursday, Saturday.  6. CODE STATUS: Patient is a DO NOT INTUBATE, DO NOT RESUSCITATE. Again, he refused home health services presently.   TIME SPENT: 40 minutes.  ____________________________ Rolly PancakeVivek J. Cherlynn KaiserSainani, MD vjs:cms D: 04/14/2012 14:43:00 ET T: 04/16/2012 14:27:00 ET JOB#: [id number] cc: Rolly PancakeVivek J. Cherlynn KaiserSainani, MD, <Dictator> Lyndon CodeFozia M. Khan, MD Houston SirenVIVEK J Adalaya Irion MD ELECTRONICALLY SIGNED 04/21/2012 17:37

## 2014-09-17 NOTE — H&P (Signed)
PATIENT NAME:  [name], [name] MR#:  [id number] DATE OF BIRTH:  09/09/1927  DATE OF ADMISSION:  01/01/2012  REFERRING PHYSICIAN: Dr. Clemens Catholic  PRIMARY CARE PHYSICIAN: Dr. Beverely Risen  PULMONOLOGIST: Dr. Welton Flakes    CHIEF COMPLAINT: Nausea, vomiting, diarrhea.   HISTORY OF PRESENT ILLNESS: Patient is a pleasant 81 year old African American male with history of chronic kidney disease stage V who is awaiting initiation of dialysis, hypertension, chronic obstructive pulmonary disease who was recently hospitalized between 07/18 and 07/19 for chronic obstructive pulmonary disease exacerbation during which she underwent bronchoscopy for work-up for right upper lobe mass. He underwent FNA as well and per chart there is no appearance of malignant cells. He was followed up with Dr. Welton Flakes, his pulmonologist, last Wednesday. He was started on Levaquin as bronc cultures done on the 19th showed Klebsiella. Wednesday night he started to have nausea, vomiting, and diarrhea with abdominal cramps. He has had poor p.o. intake and p.o. intolerance as well. The diarrhea has been about 4 times or more total, however, he cannot give a clear picture. He has been having vomiting as well. Last episode of nausea, vomiting and diarrhea were today. The diarrhea is brownish. There is no blood. The vomiting is also brownish in color. He has been having no fevers, productive cough, increased shortness of breath. As his symptoms persisted, he came here. Hospitalist service was contacted for further evaluation and management.   PAST MEDICAL HISTORY:  1. Chronic obstructive pulmonary disease. 2. Hypertension. 3. GERD.  4. Benign prostatic hypertrophy.  5. Chronic kidney disease, stage V who underwent left upper extremity fistula placement, awaiting dialysis initiation.   ALLERGIES: Sulfa and full dose aspirin which can cause nausea.   SOCIAL HISTORY: Long term smoker, quit before. No alcohol or drug use. Lives with his niece.   FAMILY  HISTORY: Denies any history of coronary artery disease, diabetes or hypertension.   MEDICATIONS:  1. Advair 250/50, 2 puffs inhaled 2 times a day.  2. Amlodipine 10 mg daily.  3. Aspirin 81 mg daily.  4. Fexofenadine 60 mg 3 tabs once a day. 5. Levaquin 750 mg 1 tab q.24 hours. 6. Sodium bicarbonate 650 mg 1 tab 2 times a day.   REVIEW OF SYSTEMS: CONSTITUTIONAL: No weight gain or weight loss. No fevers. EYES: No blurry vision or double vision. ENT: No tinnitus or postnasal drip. Has some chronic hearing loss. RESPIRATORY: No cough. No wheezing. Has history of chronic obstructive pulmonary disease. No increased shortness of breath than baseline. CARDIOVASCULAR: No chest pain, orthopnea, or syncope. No palpitations. GASTROINTESTINAL: Nausea, vomiting, diarrhea as above. Also has some abdominal cramps. No abdominal pain. No bloody stools. GENITOURINARY: No dysuria or hematuria. ENDOCRINE: No polyuria or nocturia. MUSCULOSKELETAL: No arthritis or gout. NEUROLOGICAL: Has global weakness without any focal weakness. PSYCHIATRIC: Denies anxiety or insomnia.   PHYSICAL EXAMINATION:  VITAL SIGNS: Temperature not documented since morning, it 97.2 on arrival, pulse 94 on arrival, last pulse 76, last blood pressure 186/70, oxygen saturation 96% on room air, respiratory rate 18.   GENERAL: Patient is a chronically ill-appearing thin African American male, pleasant, no obvious distress.   HEENT: Normocephalic, atraumatic. Pupils are equal and reactive. Extraocular muscles are intact. Anicteric sclerae. Dry mucous membranes.   NECK: Supple. No thyroid tenderness. No JVD.   CARDIOVASCULAR: S1, S2 regular rate and rhythm. No murmurs, rubs, or gallops.   LUNGS: Decreased breath sounds bilateral both lungs, more in the lower bases. No wheezing or rhonchi otherwise.  ABDOMEN: Soft. Hyperactive sounds, nontender. No organomegaly could be appreciated.   EXTREMITIES: No significant lower extremity edema.    SKIN: No obvious rashes.   NEUROLOGICAL: Cranial nerves II through XII grossly intact. Strength is 5/5 in all extremities.   PSYCH: Awake, alert, oriented x3. Pleasant, cooperative.   LABORATORY, DIAGNOSTIC, AND RADIOLOGICAL DATA: BUN 52, creatinine 5.97. BUN was 60 on 07/18 and creatinine being 5.39 on 07/18. Sodium 136, potassium 4.4, chloride 102. LFTs: Albumin 2.4, otherwise not significantly elevated. ALT 8. Troponins negative x1. WBC 9.1, hemoglobin 12, hematocrit 37.7, platelets 268. Urinalysis not suggestive of infection. EKG normal sinus rhythm, biatrial enlargement, left axis deviation, rate 91, no acute ST elevations or depressions. There is anterior septal infarct, age indeterminate as well. Q waves in V1, 2 and V3.   ASSESSMENT AND PLAN: We have a pleasant 81 year old male with chronic obstructive pulmonary disease, hypertension, chronic kidney disease stage V awaiting initiation of dialysis, unclear when he will be started on it, however, with recent hospitalization for chronic obstructive pulmonary disease exacerbation during which he underwent bronchoscopy, FNA of  right upper lobe mass who was started on Levaquin on Wednesday is here with nausea, vomiting, diarrhea, poor p.o. intake and dizziness.  1. Nausea, vomiting, diarrhea. It is possible that this is side effects of Levaquin as it could cause all of these symptoms. Also possible that he has Clostridium difficile as his symptoms started after the antibiotic use. At this point we would start the patient on Flagyl, gentle IV fluids and comprehensive stool cultures including one for Clostridium difficile. Would start the patient on contact isolation until Clostridium difficile has been ruled out. Would start the patient on gentle IV fluids and hold the Levaquin for now. I doubt this is progression of his kidney disease as GFR is about the same as baseline there is no significant elevation of BUN. That actually is lower than before.   2. Dizziness. It is possible that from his GI losses. He also has had some poor p.o. intake contributing as well. We would symptomatically treat the patient for his nausea and vomiting with Zofran IV and start the patient on gentle IV fluids. Check orthostatics as well. Would also obtain a PT consult.  3. Chronic obstructive pulmonary disease. This appears to be stable. Would resume his Advair and start p.r.n. nebulizers.  4. Right upper lobe lung mass. This could be worked up as an outpatient. He has had a PET scan and FNA. He also has had AFBs which shows a negative smear although he did have positive QuantiFERON previously. He has no worsening shortness of breath at this point. In regards to his positive Klebsiella culture, as he has had no increased cough, shortness of breath or hypoxia than prior we would hold the antibiotic at this point.  5. Hypertension. He does have elevated blood pressure as he had not taken his blood pressure medications today. We would continue his Norvasc and start him on p.r.n. labetalol IV.  6. Chronic kidney disease. Appears to be around his baseline. He would be started on dialysis soon but he does not know when. He still does pee. There is no acute indication for starting dialysis. He is not volume overloaded, potassium is within normal limits and he is not uremic. He does not have significant confusion or acidosis. We would monitor his GFR.  7. Would start him on heparin for deep vein thrombosis prophylaxis as well.  8. Patient is FULL CODE at this point.  TOTAL TIME SPENT: 55 minutes.   ____________________________ Krystal Eaton, MD sa:cms D: 01/01/2012 15:25:56 ET T: 01/01/2012 15:43:00 ET JOB#: [id number]  cc: Krystal Eaton, MD, <Dictator> Lyndon Code, MD Yevonne Pax, MD Marcelle Smiling [facility] MD ELECTRONICALLY SIGNED 01/21/2012 15:43

## 2014-09-17 NOTE — Consult Note (Signed)
Chief Complaint:   Subjective/Chief Complaint Patient seena nd examined, please see full gi consult.  Patient seen for n/v diarrhea.  I have seen Mr [name] in the past for evaluation of IDA.  Was revently on abx, seems t to have been associated woth those sx.  Currently feeling better from n/v, still some loose stools. Stool studies negative.  Will observe for resolution of sx now that he is off the flagyl particularly.  Will start probiotic Align.   Electronic Signatures: Barnetta ChapelSkulskie, Jacquetta Polhamus (MD)  (Signed 06-Aug-13 19:55)  Authored: Chief Complaint   Last Updated: 06-Aug-13 19:55 by Barnetta ChapelSkulskie, Jahliyah Trice (MD)

## 2014-09-17 NOTE — H&P (Signed)
PATIENT NAME:  [name], [name] DATE OF BIRTH:  1927-06-15  DATE OF ADMISSION:  12/16/2011  PRIMARY CARE PHYSICIAN: Dr. Beverely RisenFozia Khan. PULMONOLOGIST: Dr. Freda MunroSaadat Khan.   CHIEF COMPLAINT: Shortness of breath.   HISTORY OF PRESENT ILLNESS: This is an 81 year old male who came to the hospital today to have an elective bronchoscopy done to work up a right upper lobe lung mass. Prior to bronchoscopy, the patient was very short of breath and not moving a lot of air. Therefore, hospitalist services were contacted for admission for chronic obstructive pulmonary disease exacerbation. The patient has underlying chronic obstructive pulmonary disease and is chronically short of breath, but was feeling a bit more winded prior to the bronchoscopy today. The patient does admit to having a recent viral infection and having a productive sputum which was clear in nature, but no fever. No nausea, no vomiting, no chest pain, no abdominal pain. No other associated symptoms.   REVIEW OF SYSTEMS: CONSTITUTIONAL: No documented fever. No weight gain, no weight loss. EYES: No blurry or double vision. ENT: No tinnitus or postnasal drip. No redness of the oropharynx. RESPIRATORY: Positive cough. No wheeze. No hemoptysis. Positive chronic obstructive pulmonary disease. CARDIOVASCULAR: No chest pain, no orthopnea, no palpitations, no syncope. GASTROINTESTINAL: No nausea, no vomiting, no diarrhea, no abdominal pain, no melena or hematochezia. GU: No dysuria or hematuria. ENDOCRINE: No polyuria or nocturia. No heat or cold intolerance. HEME: No anemia, no bruising, no bleeding. INTEGUMENTARY: No rashes. No lesions. MUSCULOSKELETAL: No arthritis, no swelling. No gout. NEUROLOGIC: No numbness, no tingling, no ataxia, no seizure-type activity. PSYCH: No anxiety, no insomnia, no ADD.   PAST MEDICAL HISTORY:  1. Chronic obstructive pulmonary disease.  2. Hypertension. 3. Gastroesophageal reflux disease.  4. Benign prostatic  hypertrophy. 5. Chronic kidney disease.   ALLERGIES: Sulfa drugs.   SOCIAL HISTORY: Does have a long 30 pack-year smoking history. No alcohol abuse. No illicit drug abuse. Lives at home with his niece and other family.   FAMILY HISTORY: He has no significant family history of any coronary artery disease, diabetes or malignancy.   CURRENT MEDICATIONS:  1. Ensure t.i.d. with meals. 2. Allegra 180 mg daily.  3. Aspirin 81 mg daily.  4. Advair 250/50, one puff b.i.d.  5. Norvasc 10 mg daily.  6. Promethazine 12.5 mg 4 times daily as needed.  7. Sodium bicarbonate 650 mg b.i.d.   PHYSICAL EXAMINATION:    VITAL SIGNS: Temperature 97.1, pulse 79, respirations 14, blood pressure 159/60, saturations 97% on room air.   GENERAL: He is a pleasant appearing male in no apparent distress.   HEENT: Atraumatic, normocephalic. Extraocular muscles are intact. Pupils are equal and reactive to light. Sclerae anicteric. No conjunctival injection. No pharyngeal erythema.   NECK: Supple. No jugular venous distention, no bruits, no lymphadenopathy, no thyromegaly.   HEART: Regular rate and rhythm. No murmurs, rubs, or clicks.   LUNGS: He has some prolonged inspiratory and expiratory phase although negative use of accessory muscles. No dullness to percussion. Minimal end expiratory wheezing.   ABDOMEN: Soft, flat, nontender, nondistended. Has good bowel sounds. No hepatosplenomegaly appreciated.   EXTREMITIES: No evidence of any cyanosis, clubbing, or peripheral edema. Has +2 pedal and radial pulses bilaterally.   NEUROLOGICAL: The patient is alert, awake, and oriented x3 with no focal motor or sensory deficits appreciated bilaterally.   SKIN: Moist and warm with no rash appreciated.   LYMPHATIC: There is no cervical or axillary lymphadenopathy.   LABORATORY DATA:  Currently pending.   ASSESSMENT AND PLAN: This is an 81 year old male with history of chronic obstructive pulmonary disease,  hypertension, benign prostatic hypertrophy, gastroesophageal reflux disease, chronic kidney disease, who presents to the hospital for a bronchoscopy to work-up a right upper lobe lung mass. Prior to bronchoscopy the patient was noted to be in shortness of breath and being admitted for chronic obstructive pulmonary disease exacerbation.  1. Chronic obstructive pulmonary disease exacerbation. We will start the patient on some IV Solu-Medrol, place him on DuoNebs around-the-clock, continue his Advair and follow him clinically.  2. Right upper lobe lung mass. The patient is PET scan positive. Questionable if this is lung cancer versus tuberculosis as the patient's QuantiFERON was positive. He was scheduled to have a bronch today but was canceled due to his chronic obstructive pulmonary disease exacerbation. I will go ahead and consult pulmonary and they plan on doing a bronch tomorrow to work this.  3. Hypertension. I will continue his Norvasc. He is hemodynamically stable.  4. Chronic kidney disease. The patient does have a left upper arm fistula. He is going to be started on dialysis soon. I will check his BUN and creatinine and follow currently. There is no urgent indication for dialysis.  5. Gastroesophageal reflux disease. Continue with Prilosec.   CODE STATUS: The patient is a FULL CODE.   TIME SPENT WITH ADMISSION: 45 minutes.  ____________________________ Rolly Pancake. Cherlynn Kaiser, MD vjs:ap D: 12/16/2011 13:29:59 ET                T: 12/16/2011 14:03:49 ET JOB#: [id number] cc: Rolly Pancake. Cherlynn Kaiser, MD, <Dictator> Lyndon Code, MD Houston Siren MD ELECTRONICALLY SIGNED 12/16/2011 15:35

## 2014-09-17 NOTE — H&P (Signed)
PATIENT NAME:  [name], Zackeriah L MR#:  [id number] DATE OF BIRTH:  [birth year]  DATE OF ADMISSION:  04/12/2012  REASON FOR ADMISSION: Dyspnea and generalized weakness.   REFERRING PHYSICIAN: Dr. Mindi JunkerGottlieb   PRIMARY CARE PHYSICIAN: Dr. Freda MunroSaadat Khan   NEPHROLOGIST: Dr. Cherylann RatelLateef    HISTORY OF PRESENT ILLNESS: Mr. [name] is a very nice 81 year old gentleman who was recently admitted here on 03/19/2012 due to the same reason he is coming in for today. The patient at the moment has increased shortness of breath and severe weakness. He has history of end-stage renal disease, COPD, diverticulosis, BPH, GERD, and secondary hyperparathyroidism. He has been doing okay. When he was discharged home the last time he went home with home health. He didn't have much follow-up with physical therapy but he states he is starting to do better. He is able to ambulate around a little bit with some help and is being cared for by his niece. I spoke with his niece and his nephew for a long time. So far they said that he was doing okay but today he had significant decline. He was feeling really weak. He could not get up. He started to get really dyspneic. As far as his dyspnea, the patient has significant increase of shortness of breath for the past week with decreased appetite, not eating anything, only drinking some. Occasionally he takes bites of food but he cannot put much on his abdomen because he gets really nauseated and occasionally has vomiting. Overall he is concerned about his weakness and his lack of appetite. He saw Dr Cherylann RatelLateef, his nephrologist yesterday who gave him an appetite stimulant.   REVIEW OF SYSTEMS: CONSTITUTIONAL: The patient states that he had fever today. He does not quantify. The patient is very fatigued and very weak. He denies any significant pain. EYES: Denies any changes on his eyesight. He wears glasses. No eye inflammation. ENT: Denies any tinnitus, nasal congestion, difficulty swallowing. RESPIRATORY:  Denies any hemoptysis. He does have cough. He has been coughing a lot. He has been having progressive shortness of breath. He has occasional wheezing. Today he had a lot of sputum coming out at once and he said it was white but very thick. CARDIOVASCULAR: No chest pain, no orthopnea, no syncope, no palpitations. GI: Positive nausea, vomiting occasionally. No hematemesis. No melena. He has been constipated lately but he takes Maalox. No diarrhea. GU: The patient does not make urine. ENDOCRINOLOGY: No polyuria, polydipsia, polyphagia. No cold or heat intolerance. HEMATOLOGIC/LYMPHATIC: No significant bleeding. No anemia. No easy bruising. No swollen glands. SKIN: Without significant rashes or lesions. MUSCULOSKELETAL: No significant pain in back, neck, shoulder, or knee. No gout. No swelling of joints. NEUROLOGIC: No numbness. No weakness. No TIAs. No CVAs. PSYCHIATRIC: No depression or anxiety.   PAST MEDICAL HISTORY:  1. End-stage renal disease, on dialysis Tuesday, Thursday, Saturday.  2. Chronic obstructive pulmonary disease. Does not use oxygen at home.  3. Hypertension.  4. Gastroesophageal reflux disease. 5. Benign prostatic hypertrophy.  6. Chronic disease anemia.  7. Positive left lung apical mass versus scarring status [facility] showing [facility] tissue.  8. Remote history of tuberculosis.  9. Irritable bowel syndrome.   10. Secondary hyperparathyroidism.  11. Severe malnutrition. 12. Seasonal allergies.  13. Failure to thrive.   PAST SURGICAL HISTORY:  1. History of AV fistula.  2. Recent lung biopsy.   SOCIAL HISTORY: The patient used to smoke. He smoked all his life. He quit about two months  ago. He smoked a pack a day. He does not drink. He does not use drugs. He lives with his niece who takes care of him.   FAMILY HISTORY: No coronary artery disease. No diabetes. No hypertension. Parents died from old age.   ALLERGIES: The patient is allergic to aspirin, contrast,  sulfa.   CURRENT MEDICATIONS:  1. Losartan 100 mg once a day. 2. Sertraline 25 mg once a day. 3. Megace 400 mg once a day.  4. Coreg 12.5 mg twice daily.  5. Advair Diskus 250/50 two puffs b.i.d. 6. Combivent p.r.n.  7. Albuterol ipratropium p.r.n.  8. Simethicone 80 mg 4 times a day.   PHYSICAL EXAMINATION:   VITAL SIGNS: Blood pressure 180/77, pulse 66, respiratory rate 24, temperature 97.6.   GENERAL: The patient is alert and oriented x3. He is cachectic. He is laying down on bed but no significant acute distress.   HEENT: His eyes are sunken. There is wasting of temporal muscles. No oral lesions. No oropharyngeal exudates. Pupils are equal and reactive. Extraocular movements intact. Anicteric sclerae. Pink conjunctivae.   NECK: Supple. Very decreased soft tissue in the neck. No JVD. No thyromegaly. No masses. Range of motion is normal.   CARDIOVASCULAR: Regular rate and rhythm. No murmurs, rubs, or gallops. No displacement of PMI. No thrills.   LUNGS: Low intensity wheezing both lungs. No rales. There is significant decrease of respiratory sounds in left apical areas. Dullness to percussion in that area. No dullness to percussion in other areas.   ABDOMEN: Soft, nontender, nondistended. Excavated abdomen, very thin skin. No masses. No hepatosplenomegaly. Bowel sounds are positive.   GENITAL: Negative for external lesions. The patient wears a diaper.   EXTREMITIES: Decreased muscle mass. No edema. Pulses +2. No cyanosis. No clubbing.   MUSCULOSKELETAL: Very debilitated muscles, generally weak, 4 out of 5 in four extremities.   NEUROLOGIC: Cranial nerves II through XII intact.    PSYCHIATRIC: Negative for anxiety or agitation.   LYMPHATIC: Negative for lymphadenopathy in neck or supraclavicular.   LABORATORY, DIAGNOSTIC, AND RADIOLOGICAL DATA: EKG normal sinus rhythm. Mild criteria for LVH. Left axis deviation.   BNP 16,000. BUN 18, creatinine 3.53, calcium 8.4, serum  albumin 3.2. Troponin is negative. Hemoglobin 12.7, white count 7.6, platelet count 126. Urinalysis with increased protein, no white blood cells. pH 7.24, pCO2 57, pO2 70, HCO3 24.4.   CT scan of the chest no changes in previous thickening, mass lesion and cavitation of the left upper lobe. No acute infiltrates.   ASSESSMENT AND PLAN:  1. Dyspnea due to COPD exacerbation. The patient is requiring oxygen, hypoxemic with a pO2 of 70 and oxygen sats in the low 90's. The patient now is on nasal cannula. I do not see any significant infection although the patient got a dose of Levaquin. Right now I'm going to put him on IV steroids and I am going to supply oxygen.  2. Failure to thrive. The patient has significant weakness. He might need placement after this hospitalization. We are going to give him appetite stimulant. Try to give him a nutrition consult. He has severe malnutrition based on his BMI which is 13.  3. End-stage renal disease on hemodialysis. There is no significant findings of fluid overload on his CT scan or the chest x-ray for what I think he can wait for dialysis tomorrow.  4. Hypertension. Continue losartan and Coreg.  5. GERD. Continue PPI.  6. Anemia of chronic disease, stable. Hemoglobin 12.7. 7. Secondary hyperparathyroidism,  stable.   CODE STATUS: The patient was a FULL CODE but after a long conversation he has decided to be a DO NOT RESUSCITATE. He does not want any cardiac massage, intubation, electric shocks, or drugs.   TIME SPENT WITH THIS ADMISSION: 40 minutes.  ____________________________ Felipa Furnace, MD rsg:drc D: 04/12/2012 20:33:48 ET T: 04/13/2012 08:08:40 ET JOB#: [id number] cc: Felipa Furnace, MD, <Dictator>, Yevonne Pax, MD Pearletha Furl MD ELECTRONICALLY SIGNED 04/13/2012 15:06

## 2014-09-17 NOTE — Consult Note (Signed)
Brief Consult Note: Diagnosis: NVD.   Patient was seen by consultant.   Consult note dictated.   Comments: Appreciate consult for 81 y/o [place] with history of TB, lung mass, COPD, CKD,  in regards to NVD, onset after abx therapy related to Klebsiella infection in lungs. Has been on Levaquin and Flagyl. Noted c-diff, culture, and wbc testing negative. Noted ID consult today in which Flagyl stopped. Patient states he is feeling much better at present. NV stopped prior to today, and has only had 3loose stools today, in relation to frequent multiple stools in past days. Denies abdominal pain and fulness at present Impression and plan: NVD likely antibiotic side effect. Stool tests negative. Feeling better. Would continue to observe how he does off abx.  Electronic Signatures: Vevelyn PatLondon, Stephfon Bovey H (NP)  (Signed 06-Aug-13 17:20)  Authored: Brief Consult Note   Last Updated: 06-Aug-13 17:20 by Keturah BarreLondon, Tyrail Grandfield H (NP)

## 2014-09-17 NOTE — Consult Note (Signed)
PATIENT NAME:  [name], [name] MR#:  [id number] DATE OF BIRTH:  10-03-1927  DATE OF CONSULTATION:  01/04/2012  REFERRING PHYSICIAN:  Mady Haagensen, MD  CONSULTING PHYSICIAN:  Rosalyn Gess. Kally Cadden, MD  REASON FOR CONSULTATION: Positive QuantiFERON-TB Gold and a cavitary lung mass.   HISTORY OF PRESENT ILLNESS: The patient is an 81 year old black man with a past history significant for active pulmonary tuberculosis in the 1970's, end-stage renal disease, COPD, and a chronic lung mass who was admitted on 01/01/2012 with nausea, vomiting, and diarrhea. The patient has had abnormalities in his chest x-ray dating as far back as 2005. At that time on 06/16/2004 a chest x-ray showed pleural fibrosis noted at the left base and the left apex and calcification noted in the left midlung field. This was not thought to be changed from 04/03/2001. The right side was clear. In 2007 he had a chest x-ray which showed pleural fibrotic changes in the left. A CT scan was obtained of the neck in 2007 which showed left pulmonary apex pleural parenchymal thickening with calcification most likely scarring. In October of 2010 a CT of the chest demonstrated an abnormal appearance of the left pulmonary apex where there was a soft tissue density as well as calcification and possible cavitary lesions. There were tiny nodules in both lungs predominantly peripherally which were nonspecific. Around this time the patient had a bronchoscopy and, according to Dr. Welton Flakes who performed the procedure, he had Klebsiella isolated but AFBs were negative at that time. The patient states that he has been losing weight for approximately 10 years, although he's had approximately five pound weight loss over the last several weeks per Nephrology. A follow-up chest x-ray in January of 2012 showed no significant change in the left lung cavity. As his renal failure had worsened, he was being evaluated for starting hemodialysis and as part of the routine work-up  Nephrology performed a QuantiFERON-TB Gold which was positive. He underwent further evaluation by Dr. Welton Flakes including a repeat CT of the chest on July 8th. This demonstrated findings in the left lung apex which possibly could be worsening fibrosis and scarring. There was a lobular area of soft tissue density that had increased in size within the region of cavitation. It is also noted that there were calcified pleural plaques consistent with prior asbestos exposure and multiple subcentimeter pulmonary nodules in the right lung that were somewhat new. He had a PET CT on July 16th which was positive in the nodular densities in the left lung and the right hilar and infrahilar lymph nodes as well as infiltrate in the right lung base. The patient underwent bronchoscopy and AFB smears were negative. Cultures are still pending. A fine needle aspirate was negative for malignancy but does not appear to be sampling the area in the apex. Routine cultures grew Klebsiella and the patient was given levofloxacin. He was admitted with nausea and vomiting thought to be related to levofloxacin and this has subsequently been stopped. Currently the patient is on metronidazole, although his C. difficile PCR is negative. He has been afebrile during his hospitalization. He has not had any cough or shortness of breath. No significant sputum production and overall feels fairly well. A CT scan during this hospitalization demonstrated chronic stable changes bilaterally.    ALLERGIES: Aspirin, iodinated radiocontrast dye, and sulfa drugs.   PAST MEDICAL HISTORY:  1. Active pulmonary tuberculosis in the 1970's. He was in a sanitarium and released after three months. He does not recall receiving  any medication after being discharged.  2. Chronic obstructive pulmonary disease.  3. Hypertension.  4. Gastroesophageal reflux disease. 5. Benign prostatic hypertrophy.  6. Chronic renal failure status post left upper extremity fistula  placement. He has started dialysis in the hospital.   SOCIAL HISTORY: The patient lives with several family members including a child around age 303. He is a long-term smoker but quit a few months ago. He is a prior drinker but does not drink any further. No injecting drug use history.   FAMILY HISTORY: Negative for tuberculosis.   REVIEW OF SYSTEMS: GENERAL: No fevers, chills, sweats, or malaise. Positive weight loss over the last 10 years. HEENT: No headaches, no sinus congestion, no sore throat. NECK: No stiffness, no swollen glands. RESPIRATORY: No cough, no shortness of breath, no sputum production. He does have some dyspnea on exertion related to his COPD, however. CARDIOVASCULAR: No chest pains, no palpitations, no peripheral edema. GI: Positive nausea, vomiting, and diarrhea all of which have improved with stopping the Levaquin. GU: No complaints. MUSCULOSKELETAL: No complaints. SKIN: No rashes. NEUROLOGIC: No focal weakness. PSYCHIATRIC: No complaints. All other systems are negative.   PHYSICAL EXAMINATION:   VITAL SIGNS: T-max 98.3, T-current 98.1, pulse 79, blood pressure 140/57, 99% on room air.   GENERAL: 81 year old cachectic black man in no acute distress.   HEENT: Normocephalic, atraumatic. Pupils equal and reactive to light. Extraocular motion intact. Sclerae, conjunctivae, and lids are without evidence for emboli or petechiae. Oropharynx shows no erythema or exudate. Teeth and gums are in fair condition.   NECK: Supple. Full range of motion. Midline trachea. No lymphadenopathy. No thyromegaly.   CHEST: Clear to auscultation bilaterally with the exception of decreased breath sounds in the right upper lung field. There were no E to A changes and no tactile fremitus appreciated. He is able to speak in full sentences.   CARDIAC: Regular rate and rhythm without murmur, rub, or gallop.   ABDOMEN: Soft, nontender, and nondistended. No hepatosplenomegaly. No hernia is noted.    EXTREMITIES: No evidence for tenosynovitis.   SKIN: No rashes. No stigmata of endocarditis, specifically no Janeway lesions or Osler nodes.   NEUROLOGIC: The patient is awake and interactive, moving all four extremities.   PSYCHIATRIC: Mood and affect appeared normal.   LABORATORY AND RADIOLOGICAL DATA: BUN 55, creatinine 6.15, bicarbonate 20, anion gap 13. LFTs are unremarkable. White count 7.2 with a hemoglobin of 10.8, platelet count 236, ANC 4.9. Stool studies including routine culture and C. difficile PCR are negative. Urinalysis was unremarkable.   CT of the abdomen and pelvis without contrast showed moderately distended rectum with stool present. There was diffuse anasarca. There were multiple 5 mm indeterminate pulmonary nodules noted in the bases.   A CT scan of the chest without contrast demonstrated the chronic changes bilaterally as seen on previous scans.   IMPRESSION: This is an 81 year old black man with history of active pulmonary TB, COPD, and end-stage renal disease who is admitted with nausea, vomiting, and diarrhea due to levofloxacin therapy who has a cavitary lung mass.    RECOMMENDATIONS:  1. He has a history of cavitary pulmonary disease and a recent positive QuantiFERON-TB Gold. He underwent bronchoscopy recently which was AFB negative. I spoke with Dr. Welton FlakesKhan who has been working up his lung mass for the past five years. He has had a prior bronchoscopy in 2010 which Dr. Welton FlakesKhan reported as only growing Klebsiella (as did the recent bronchoscopy). The patient has been losing weight over  the past 10 years but has lost approximately 5 pounds in the last several weeks. He has no cough or shortness of breath. I would expect that active cavitary TB would be AFB-positive on bronch due to the large number of organisms usually found in this instance.  2. His PET scan was positive. This suggests an active process in the lung but does not distinguish between infection and malignancy.   3. He is going to start hemodialysis and the hemodialysis center will want confirmation this is not active TB. Will place him on isolation and will get three sputums over the next 24 hours (per the new CDC guidelines) and rule out tuberculosis.  4. [facility] work-up of his lung mass. It is possible that he has developed a slow growing malignancy in prior scarring from tuberculosis.  5. Will discontinue the metronidazole as his C. difficile PCR was negative.   This is a highly complex Infectious Disease consult. Thank you very much for involving me in Mr. Nembhard's care.   ____________________________ Rosalyn Gess. Mikenna Bunkley, MD meb:drc D: 01/04/2012 16:50:05 ET T: 01/05/2012 08:42:36 ET JOB#: [id number]  cc: Rosalyn Gess. Junetta Hearn, MD, <Dictator> Von Quintanar E Lourdes Kucharski MD ELECTRONICALLY SIGNED 01/21/2012 15:11

## 2014-09-17 NOTE — Discharge Summary (Signed)
PATIENT NAME:  [name], Draco L MR#:  [id number] DATE OF BIRTH:  10-Mar-1928  DATE OF ADMISSION:  03/19/2012 DATE OF DISCHARGE:  03/20/2012  ADMITTING DIAGNOSES:  1. Generalized weakness. 2. Shortness of breath. 3. Right pleural effusion. 4. Chronic obstructive pulmonary disease.   DISCHARGE DIAGNOSES:  1. Dyspnea likely due to chronic obstructive pulmonary disease.  2. Malignant hypertension.  3. End-stage renal disease.  4. Right pleural effusion, small. 5. Generalized weakness.  6. Constipation.  7. History of gastroesophageal reflux disease.  8. Left lung mass. 9. Anemia. 10. Secondary hyperparathyroidism.  11. Benign prostatic hypertrophy.  12. Irritable bowel syndrome.   DISCHARGE CONDITION: Stable.   DISCHARGE MEDICATIONS: The patient is to continue the following. 1. Advair Diskus 250/50 two puffs twice daily.  2. Megace 400 mg p.o. daily.  3. Coreg 12.5 mg p.o. daily.  4. Combivent Respimat one puff four times daily as needed.  5. Simethicone 80 mg p.o. four times daily. 6. Nephrocaps 1 tablet once daily.  7. Losartan 100 mg p.o. at bedtime.  8. Albuterol ipratropium 2.5/0.5 mg in 3 mL inhalation solution four times a day.   REFERRALS: Home health with physical therapy 2 to 7 times a week.   DIET: 2 grams salt, low fat, low cholesterol, hemodialysis diet. 1.5 liters/24 hour fluid restriction. Mechanical soft diet.   ACTIVITY LIMITATIONS: As tolerated.   DISCHARGE FOLLOWUP/INSTRUCTIONS: Follow-up appointment with Dr. Freda MunroSaadat Khan in 2 days after discharge. Also hemodialysis tomorrow, on 03/21/2012.   CONSULTANTS:  1. Care Management. 2. Mosetta PigeonHarmeet Singh, MD. 3. Erin FullingKurian Kasa, MD.  RADIOLOGIC STUDIES: Chest x-ray, PA and lateral, on 03/18/2012, showed findings consistent with chronic obstructive pulmonary disease, chronic changes in left upper hemothorax are present, small amount of pleural fluid is present but there is no overt evidence of pulmonary vascular  congestion.  CT scan of abdomen and pelvis without contrast on 03/19/2012 revealed a study which was quite limited due to oral as well as intravenous contrast lack. Bowel pattern did not suggest obstruction, but was nonspecific. According to the radiologist, there may be fecal impaction in the appropriate clinical setting, although considerable amount of gas is also present in the rectum. There was no definite evidence of acute hepatobiliary abnormality or acute urinary tract abnormality. New moderate-sized right pleural effusion with right basilar atelectasis and/or pneumonia was noted.   Chest CT without contrast on 03/19/2012 revealed since previous study moderate-sized right pleural effusion development. There are chronic changes in left upper lobe manifested by volume loss and a stable-appearing soft tissue mass. The findings may be infectious or neoplastic or combination of both. There are underlying emphysematous changes.   Ultrasound for right thoracentesis was performed on 03/20/2012. However, a small amount of pleural fluid was noted, but no significant amount for thoracentesis. Thoracentesis procedure was canceled.   HISTORY/HOSPITAL COURSE: The patient is an 81 year old African American male with past medical history significant for history of chronic obstructive pulmonary disease, emphysema, end-stage renal disease, and hypertension who presented to the hospital with complaints of weakness and feeling tired as well as having some shortness of breath. He had no other symptoms. No fevers and no sputum production or any other infectious signs.   His vital signs on the day of admission showed a temperature of 97.9, pulse 88, respiration 20, blood pressure 219/86, and saturation was 100% on oxygen therapy. Physical exam showed no significant changes in respiratory examination. His chest x-ray done on admission, 03/18/2012, showed just findings consistent with chronic obstructive pulmonary  disease  and chronic changes in the left upper hemithorax, but otherwise no significant changes. The patient had a CT scan of his chest done which showed right pleural effusion and it was felt that the patient's shortness of breath as well as his weakness could be related to right pleural effusion. However, the patient was attempted to have right thoracentesis, however, fluid amount was very little and thoracentesis was canceled. The patient had underlying emphysematous changes and it was felt that the patient's condition as well as his dyspnea is probably due to chronic respiratory failure. The patient had an echocardiogram done while he was in the hospital, on 03/19/2012, which showed normal left ventricular systolic function, ejection fraction equal or more than 55%, mild to moderate concentric left ventricular hypertrophy and right ventricular systolic function was no noted to be normal and right ventricular systolic pressures were noted to be normal. It was felt that the patient's dyspnea and shortness of breath as well as his weakness could have been related just to his chronic obstructive pulmonary disease and DuoNebs where recommended for him twice a day. The patient had his white blood cell count checked on arrival to the hospital. His white blood cell count was 8.0 and he did not have any fevers while he was in the hospital. There were no signs of infection so antibiotic therapy was not initiated. The patient was weaned off oxygen and on room air his oxygen saturation 98%. The patient is to follow up with his primary care physician, Dr. Welton Flakes, for further recommendations.   In regards to malignant hypertension, The patient's blood pressure medications were advanced. The patient is to continue Coreg and losartan. Unfortunately, in the hospital, Losartan was not ordered so we did not have good blood pressure readings while he was in the hospital. On day of discharge, the patient's blood pressure is still a little  high. Temperature is 98.3, pulse is 50s to 60s, respiratory rate was 18, blood pressure ranging from 150 to 180s systolic, and oxygen saturation was 98% on room air at rest. It was felt that patient's weakness could be also related to his malignant hypertension.   For end-stage renal disease, the patient will have hemodialysis done tomorrow, on 03/21/2012. Dr. Thedore Mins and is going to coordinate this.   For constipation, the patient is to continue medications to help him with constipation. Those can be obtained over-the-counter in any pharmacy.  For gastroesophageal reflux disease, the patient is to continue his outpatient management.  For chronic medical problems such as anemia, secondary hyperparathyroidism, benign prostatic hypertrophy, and irritable bowel syndrome the patient is to continue his outpatient medications.   The patient was offered physical therapy at home. He is to have physical therapy 2 to 7 times a week due to generalized weakness as well as shortness of breath. He is being discharged in stable condition with the above-mentioned medications and follow-up.   TIME SPENT: 40 minutes. ____________________________ Katharina Caper, MD rv:slb D: 03/20/2012 17:17:13 ET T: 03/21/2012 10:08:01 ET JOB#: [id number]  cc: Katharina Caper, MD, <Dictator> Yevonne Pax, MD  Katharina Caper MD ELECTRONICALLY SIGNED 04/14/2012 12:25

## 2014-09-17 NOTE — Consult Note (Signed)
Chief Complaint:   Subjective/Chief Complaint seen for diarrhea. no nausea, tolerating regular po diet.  stools beginning to firm.  normal color. no abdominal pain.   VITAL SIGNS/ANCILLARY NOTES: **Vital Signs.:   08-Aug-13 14:02   Vital Signs Type Routine   Temperature Temperature (F) 98.1   Celsius 36.7   Temperature Source Oral   Pulse Pulse 106   Respirations Respirations 20   Systolic BP Systolic BP 108   Diastolic BP (mmHg) Diastolic BP (mmHg) 58   Mean BP 74   Systolic BP Systolic BP 108   Diastolic BP (mmHg) Diastolic BP (mmHg) 58   Systolic BP Systolic BP 101   Diastolic BP (mmHg) Diastolic BP (mmHg) 48   Systolic BP Systolic BP 108   Diastolic BP (mmHg) Diastolic BP (mmHg) 58   Pulse Ox % Pulse Ox % 98  *Intake and Output.:   08-Aug-13 05:44   Stool  large soft stool    17:00   Stool  moderate loose stool   Brief Assessment:   Cardiac Regular    Respiratory clear BS    Gastrointestinal details normal Soft  Nontender  Nondistended  No masses palpable  Bowel sounds normal   Lab Results: Routine Chem:  08-Aug-13 07:10    Phosphorus, Serum 4.0 (Result(s) reported on 06 Jan 2012 at 08:18AM.)  Routine Hem:  08-Aug-13 03:22    WBC (CBC) 8.4   RBC (CBC) 4.43   Hemoglobin (CBC)  10.5   Hematocrit (CBC)  33.1   Platelet Count (CBC)  144 (Result(s) reported on 06 Jan 2012 at 05:39AM.)   MCV  75   MCH  23.7   MCHC  31.7   RDW  20.3   Segmented Neutrophils 74   Lymphocytes 11   Monocytes 5   Eosinophil 10   Diff Comment 1 ANISOCYTOSIS   Diff Comment 2 POIKILOCYTOSIS   Diff Comment 3 TARGET CELLS   Diff Comment 4 NORMAL PLT MORPHOLGY  Result(s) reported on 06 Jan 2012 at 05:39AM.   Assessment/Plan:  Assessment/Plan:   Assessment 1) diarrhea-improving.  Prior to o/p abx before coming to the hospital patient had formed stool.  Loose stool started abruptly after starting abx.  Currently stools are soft semiformed.  C. diff negative, stools negative.  Patient  on probiotic.    Plan continue current. no new GI recs. will follow at a distance.   Electronic Signatures: Barnetta ChapelSkulskie, Zavon Hyson (MD)  (Signed 08-Aug-13 18:48)  Authored: Chief Complaint, VITAL SIGNS/ANCILLARY NOTES, Brief Assessment, Lab Results, Assessment/Plan   Last Updated: 08-Aug-13 18:48 by Barnetta ChapelSkulskie, Markee Remlinger (MD)

## 2014-09-17 NOTE — Discharge Summary (Signed)
PATIENT NAME:  [name], [name] DATE OF BIRTH:  03/20/1928  DATE OF ADMISSION:  12/16/2011 DATE OF DISCHARGE:  12/17/2011  PRESENTING COMPLAINT: Shortness of breath.   DISCHARGE DIAGNOSES:  1. Chronic obstructive pulmonary disease exacerbation, resolved.  2. Right upper lobe mass. Work-up in progress, status post bronchoscopy done by Dr. Freda MunroSaadat Black on 12/17/2011. 3. Chronic kidney disease, stage IV.  4. Chronic smoker.   DISCHARGE MEDICATIONS:  1. Aspirin 81 mg daily.  2. Amlodipine 10 mg daily.  3. Fexofenadine 60 mg 2 tablets daily as needed.  4. Advair 250/50 two puffs twice a day.  5. Promethazine 12.5 mg one tablet every four hours as needed.  6. Sodium bicarbonate 650 mg one tablet twice a day.  DIET: Low sodium renal diet.   FOLLOWUP: Follow up with Dr. Freda MunroSaadat Black in 1 to 2 weeks.   LABS/RADIOLOGIC STUDIES: Chest x-ray post bronchus showed no complications.   Bronchial wash culture showed light growth of Klebsiella pneumoniae.   AFB culture results are pending.   White count 8.2, hemoglobin and hematocrit is 11.1 and 37.1, and platelet count is 292. Basic metabolic panel: Creatinine 5.3, BUN 60, glucose 160, sodium 138, potassium 4.5, chloride 106, and bicarbonate 22.   Fine needle aspiration cytology from right main stem is negative for malignant cells, normal bronchial cells and macrophages present.   BRIEF SUMMARY OF HOSPITAL COURSE: Ms. [name] is an 81 year old African American gentleman with past medical history of chronic obstructive pulmonary disease, hypertension, benign prostatic hypertrophy, gastroesophageal reflux disease, and chronic kidney disease who presented to the hospital for bronchoscopy for workup on his right upper lobe lung mass on prior bronchoscopy. The patient was noted to be short of breath and was admitted for chronic obstructive pulmonary disease flare. He was admitted with:  1. Chronic obstructive pulmonary disease  exacerbation. He received Solu-Medrol around-the-clock. Currently not in distress. His steroids were discontinued. The patient tolerated the bronchoscopy well and prior to discharge remained stable and not in respiratory distress. His inhalers were continued and nebulizers were given around the clock.  2. Right upper lobe lung mass. The patient has PET scan positive lung mass, questionable lung cancer, versus fungal, versus TB. The patient's QuantiFERON is positive too. He is status post bronchoscopy. No endobronchial lesions were noted on bronchoscopy, per Dr. Freda MunroSaadat Black. The patient will follow up with further evaluation on this with Dr. Welton FlakesKhan as outpatient. 3. Hypertension. Norvasc was continued.  4. CKD. The patient does have a left upper arm fistula. He is going to be started on dialysis soon. There was no urgent indication for dialysis.  5. Gastroesophageal reflux disease. Prilosec was continued.  His hospital stay otherwise remained stable.   TIME SPENT: 40 minutes. ____________________________ Larry HailSona A. Allena KatzPatel, MD sap:slb D: 12/27/2011 12:35:50 ET T: 12/28/2011 10:37:23 ET JOB#: [id number]  cc: Larry Paulson A. Allena KatzPatel, MD, <Dictator> Larry PaxSaadat A. Khan, MD Larry CodeFozia M. Khan, MD Larry OraSONA A Yliana Gravois MD ELECTRONICALLY SIGNED 01/04/2012 7:17

## 2014-09-17 NOTE — Discharge Summary (Signed)
PATIENT NAME:  [name], [name] MR#:  [id number] DATE OF BIRTH:  07-05-1927  DATE OF ADMISSION:  01/01/2012 DATE OF DISCHARGE:  01/11/2012  ADMITTING DIAGNOSIS: Nausea, vomiting, diarrhea.   DISCHARGE DIAGNOSES:  1. Nausea, vomiting, diarrhea, likely Levaquin-related, resolved. 2. Dyspepsia. 3. History of irritable bowel disorder. 4. Chronic kidney disease stage V, now endstage renal disease. Access left upper extremity AV fistula, status post four hemodialysis sessions during this admission, last on 01/10/2012.  He will be starting Monday, Wednesday, Friday hemodialysis schedule at [facility].   5. Pain in left upper extremity access site, likely infiltration after the last hemodialysis session on 01/10/2012, improving.  6. Secondary hyperparathyroidism. 7. Anemia of chronic disease with hemoglobin level 9.7. Guaiac-negative.  8. Chronic obstructive pulmonary disease.  9. Left apex lung mass PET- positive in the past, needs to be reevaluated as an outpatient. Recent work-up is uninformative.  10. Former tobacco abuse.  11. Hypertension, well controlled.  12. Generalized weakness.  13. Failure to thrive, malnutrition, severe protein calorie malnutrition resolving now.  14. History of benign prostatic hypertrophy.  15. Gastroesophageal reflux disease.  16. History of tuberculosis in the past, negative testing on this admission.   DISCHARGE CONDITION: Stable.   DISCHARGE MEDICATIONS: The patient is to resume his outpatient medications which are:  1. Amlodipine 10 mg p.o. daily.  2. Fexofenadine 180 mg p.o. daily as needed.  3. Advair Diskus 250/50, 2 puffs twice daily.   ADDITIONAL MEDICATIONS:  4. Norco 5/325 mg, 1 tablet every four hours as needed.  5. Maalox suspension 30 mL every four hours as needed.  6. Simethicone chewable tablets 80 mg, 4 times daily before meals and at bedtime.  7. Megace 400 mg p.o. daily.  8. Combivent Respimat CFC one puff 4 times daily.   The patient was advised not to take bicarbonate or Levaquin.   HOME OXYGEN: None.   DIET: Regular and regular consistency.   ACTIVITY LIMITATIONS: As tolerated.   REFERRAL: Home Health physical therapy as well as Charity fundraiser and aide.   FOLLOWUP:  1. Follow-up appointment with Dr. Thelma Barge two days after discharge. 2. Follow up with  Dr. Sherrlyn Hock two days after discharge. 3. Follow up with Dr. Beverely Risen two days after discharge.  Please refer to interim discharge summary dictated by Dr. Nemiah Commander on 01/07/2012.   HISTORY OF PRESENT ILLNESS: In brief, the patient is an 81 year old African American male with past medical history significant for history of chronic kidney disease as well as  PET-positive left upper lung mass who presented to the hospital with nausea, vomiting, and diarrhea. Apparently he was recently started on Levaquin for possible pneumonia and developed severe nausea, vomiting, and diarrhea. He presented to the hospital for further evaluation. In the hospital he had multiple problems worked up: 1. Nausea, vomiting, and diarrhea:  It was felt to be due to Levaquin. The medication was stopped. The patient had a CT of the abdomen that showed no acute abnormalities other than stool impacted in the rectum. His nausea and vomiting resolved; diarrhea took a few days to improve. His stool cultures were negative as well as C. difficile. The patient was seen by gastroenterologist but no additional testing was recommended.  2. Chronic kidney disease stage V: The patient had persistent poor appetite and nausea symptoms. He was seen by the nephrologist who felt that the patient had indications to start hemodialysis. He started hemodialysis during this admission. The patient already had a left upper extremity AV fistula  and diuresis was initiated. He had his last hemodialysis yesterday on 01/10/2012. He is to start Monday, Wednesday, Friday schedule [facility].  After the  last hemodialysis on 01/10/2012 he developed left upper extremity fistula infiltration and significant pain requiring some cold compresses as well as Norco as needed. He is to continue this medication and the nephrologist felt that his upper extremity fistula should be put to rest for at least this week and the next hemodialysis session should be done on 01/17/2012, on Monday next week. That was communicated to the patient. 3. History of active tuberculosis: It was known that the patient's tuberculosis was treated in the past.  However, the patient had a QuantiFERON gold test done as an outpatient, which was positive, so he initially was placed on airborne isolation and had three AFB smears done which were negative.  4. Left upper lobe mass for which he is followed by Dr. Freda MunroSaadat Khan as an outpatient: The patient had a bronchoscopy as well as biopsy done as PET scan was positive. However, biopsies on cytology showed no malignant cells and cultures were growing Klebsiella. As this was uninformative  work-up, the patient needs to follow up with his primary care physician as well as Dr. Freda MunroSaadat Khan and Dr. Hulda Marinimothy Oaks to see if the patient needs open lung biopsy for that mass. Of note, the patient had cultures which were growing Klebsiella. However, as the patient had no white blood cell count and has been afebrile, it was felt that the patient should not be initiated on antibiotic therapy, especially not Levaquin because he had such a significant reaction to it. The patient is also to follow up with Dr. Bernell ListSandy Pandit for further recommendations, whom he has already seen for anemia.  5. Anemia of chronic disease: The patient's hemoglobin level was stable and greater than 10. Stool guaiac was checked and was negative. The patient's Procrit was suspended as hemoglobin level of was stable. However, now on the day of discharge the patient's hemoglobin level is 9.7 on 01/10/2012?Marland Kitchen.  It is recommended to follow the  patient's hemoglobin level as outpatient and make decisions to reinitiate of Procrit as needed.  6. Failure to thrive, adult: Again, it was felt to be due to chronic kidney disease stage V.  Now, since the patient was initiated on hemodialysis his appetite has significantly improved. He was allowed to continue regular diet. There are no fluid restrictions. However, dietitian consultation will be obtained today on 01/11/2012 just to give him an overview of the hemodialysis diet in general.  The patient is also to follow up with his dietitian at the hemodialysis center and make decisions about transition from his regular diet to hemodialysis diet in the future. The patient is also to continue Megace. His oral intake significantly improved, as mentioned above. He eats from 50 to 75% of offered meals.  7. Generalized weakness: The patient was evaluated by the physical therapist a few times while he was in the hospital and it was felt that the patient would benefit from rehab placement. However, later reassessment revealed that he would be able to return home with Home Health physical therapy, which will be prescribed for him as well as RN and aide upon discharge to home.  8. History of benign prostatic hypertrophy, gastroesophageal reflux disease: The patient is to continue management as he previously did.  9. Chronic obstructive pulmonary disease: No exacerbations were noted during his stay. He is to continue Advair  Diskus and Combivent; new Combivent Respimat was prescribed for him upon discharge as the patient was complaining of some shortness of breath which may be independent of his endstage renal disease fluid retention issues.   The patient is being discharged in stable condition with the above-mentioned medications and followup. Of note, the patient's blood pressure was satisfactorily controlled on amlodipine.  It is recommended to follow the patient's blood pressure readings and make decisions about  initiation of additional medications if needed.  The patient's vital signs on the day of discharge are: Temperature 99, pulse 72, respiration rate 18, blood pressure 134/56, saturation 98% on room air at rest.   TIME SPENT: 40 minutes.  ____________________________ Katharina Caper, MD rv:bjt D: 01/11/2012 15:45:37 ET T: 01/13/2012 10:43:24 ET JOB#: [id number]  cc: Katharina Caper, MD, <Dictator> Lyndon Code, MD Sheppard Plumber Thelma Barge, MD Sandeep R. Sherrlyn Hock, MD Katharina Caper MD ELECTRONICALLY SIGNED 01/13/2012 14:49

## 2014-09-17 NOTE — Consult Note (Signed)
Impression: 81yo BM w/ h/o active pulmonary TB, COPD, ESRD who was admitted with N/V/D due to levofloxacin therapy who has a cavitary lung mass.  He has a history of active pulmonary TB and a recent positive Quantiferon-TB Gold.  He underwent bronchoscopy recently which was AFB negative.  I spoke with Dr. Welton FlakesKhan who has been working up his lung mass for about 3 years.  He has had a prior bronchoscopy in 2010 which Dr. Welton FlakesKhan reported only growing Klebsiella (as did the recent bronch).  The patient has been losing weight over the past 10 years, but has lost 5# in the last several weeks.  He has no cough or SOB.  I would expect that active cavitary TB would be AFB positive on bronch. His PET scan was positive.  This suggest an active process in the lung, but does not distinguish between infection and malignancy. He is going to start HD and the HD center will want confirmation that this is not active TB.  Will place him on isolation. Will get 3 sputums over the next 24 hours (per new CDC guidelines) and r/o TB. He will [facility] with [facility] for further work up of his lung mass. Will d/c the metronidazole.   Electronic Signatures: Conard Alvira, Rosalyn GessMichael E (MD) (Signed on 06-Aug-13 16:36)  Authored   Last Updated: 06-Aug-13 16:49 by York Valliant, Rosalyn GessMichael E (MD)

## 2014-09-17 NOTE — Op Note (Signed)
PATIENT NAME:  [name], [name]:  [id number] DATE OF BIRTH:  Jul 25, 1927  DATE OF PROCEDURE:  03/03/2012  PREOPERATIVE DIAGNOSIS: Complication AV dialysis fistula with increasing recirculation and prolonged bleeding following decannulation.   POSTOPERATIVE DIAGNOSIS: Complication AV dialysis fistula with increasing recirculation and prolonged bleeding following decannulation.    PROCEDURES PERFORMED:  1. Contrast injection left arm brachiocephalic fistula.  2. Percutaneous transluminal angioplasty of the subclavian and cephalic vein confluence to 8 mm. 3. Percutaneous transluminal angioplasty of the venous cannulation zone to 7 mm.  4. Placement of an 8 x 7 straight FLAIR stent for treatment of a high-grade residual stenosis associated with two areas of extravasation.   SURGEON: Renford DillsGregory G. Schnier, M.D.   SEDATION: Versed 3 mg plus fentanyl 100 mcg administered IV. Continuous ECG, pulse oximetry and cardiopulmonary monitoring was performed throughout the entire procedure by the interventional radiology nurse. Total sedation time was 50 minutes.   ACCESS: 7 French sheath left arm brachiocephalic fistula, antegrade direction.   CONTRAST USED: Isovue 55 mL.   FLUOROSCOPY TIME: 4.9 minutes.   INDICATIONS: [name] is an 81 year old gentleman who has been having increasing difficulties at dialysis with worsening parameters and prolonged bleeding. Risks and benefits for fistulogram and possible intervention were reviewed. All are in agreement with proceeding.   DESCRIPTION OF PROCEDURE: The patient is taken to special procedures and placed in the supine position. After adequate sedation is achieved, he is positioned supine with his arm extended palm upward. The left arm has been prepped and draped in a sterile fashion. 1% lidocaine is infiltrated in the soft tissues near the arterial anastomosis and a micropuncture needle is inserted in an antegrade direction, a microwire followed by micro  sheath, J-wire followed by a 6 French sheath. Hand injection of contrast is used to demonstrate the fistula as well as the central veins. Greater than 95% stenosis is noted at the cephalic confluence. Approximately 75 to 80% stenosis is noted just above the venous cannulation site.   3,000 units of heparin are given. The Magic torque wire is then exchanged for the J-wire and negotiated into the central venous anatomy. First, a 7 x 4 balloon is used to angioplasty the cephalic confluence. This is inadequate and an 8 x 4 balloon is used to treat the cephalic confluence with an excellent result and less than 10% residual stenosis. There appears to be a sclerotic valve present, but there is no limitation of flow.   The image is then repositioned to the venous cannulation lesion and the 8 x 4 balloon is used to treat this area. Two separate inflations are made. Follow-up angiography demonstrates that the areas treated with the 8 are well treated and expanded beautifully, however, there appears to be significant stenosis more proximally. This appears to be in a smaller portion of the vein and the 8 balloon is then changed for the 7 x 4 balloon and two separate inflations are made. Following this, hematoma formation is noted and extravasation at two locations is identified. Pressure is held, but extravasation is persistent and, therefore, an 8 x 70 straight fluency stent is advanced over the wire. It is then deployed. A 7 French sheath is inserted over the wire and subsequently the stent was post dilated with a 7 x 4 balloon. Follow-up angiography demonstrates rapid flow of contrast. No evidence of extravasation. All stenotic areas are well treated. By physical examination, there is continuous soft thrill with almost no pulsatility noted. A significant improvement over the  preoperative examination.   Pursestring suture is placed around the 7 [place] sheaths. The sheath is removed. Pressure is held and there are no  immediate complications.   INTERPRETATION: Initial images of the fistula demonstrate the two areas of cannulation although they are slightly enlarged and only mildly aneurysmal, are widely patent. There is reflux of contrast into the arterial during the case which demonstrates the arterial portion is widely patent. Just above the venous cannulation site there is a greater than 75% stenosis. The cephalic confluence is greater than 95% stenosis. Central veins are widely patent.   Following angioplasty to 8 millimeters, the cephalic subclavian lesion is well treated. Following angioplasty in one portion to 8 mm and then in more proximal portions 7 mm, there is extravasation noted in the 7 mm areas. This is ultimately easily controlled with a covered stent postdilated to 7.    SUMMARY: Successful salvage of left arm brachiocephalic fistula as described above.    ____________________________ Renford Dills, MD ggs:ap D: 03/03/2012 11:38:06 ET T: 03/03/2012 11:52:41 ET JOB#: [id number]  cc: Renford Dills, MD, <Dictator> Munsoor Lizabeth Leyden, MD Renford Dills MD ELECTRONICALLY SIGNED 03/06/2012 18:15

## 2014-09-20 NOTE — Consult Note (Signed)
PATIENT NAME:  [name], [name] MR#:  [id number] DATE OF BIRTH:  [birth year]  DATE OF CONSULTATION:  07/31/2012  REFERRING PHYSICIAN:  Enid Baas, MD  CONSULTING PHYSICIAN:  Rosalyn Gess. Blocker, MD  REASON FOR CONSULTATION: Cavitary lung lesion.   HISTORY OF PRESENT ILLNESS: The patient is an 81 year old man with a past history significant for active pulmonary TB, COPD, end-stage renal disease and a long-standing cavitary lung mass who was admitted on 07/29/2012 with weakness and shortness of breath. He states that he has been having worsening generalized weakness and increased shortness of breath. He was unable to characterize how long this has been going on for, but it seemed to be slowly progressive. He had been so weak recently that  he was unable to go to dialysis. The H and P indicates that he had developed a GI illness last week and started worsening since then. He has been having some chronic but slowly progressive weight loss. In the past, his cavitary lung lesion has been evaluated with multiple AFB sputa as well as bronchoscopy, all of which have been negative. A PET scan in the past and has been positive, but he has not apparently had any progression of his disease. He said that he has had some fevers and chills recently, but they are not continuing currently. He was admitted to the hospital and underwent dialysis. He states he is feeling somewhat better today. He has not been eating well in the past, but ate little bit better today. A CT scan of his chest showed no significant change in his lung lesion.   ALLERGIES: ASPIRIN, IODINATED CONTRAST DYE AND SULFA DRUGS.   PAST MEDICAL HISTORY: 1. End-stage renal disease on hemodialysis.  2. Active pulmonary tuberculosis in the past in the 1970s. He was in a sanitarium and released after 3 months. He does not recall receiving any medications after being discharged. He has had multiple AFB smears obtained, all of which have been culture  negative.  3. COPD.  4. Hypertension.  5. GERD.  6. Benign prostatic hypertrophy.   SOCIAL HISTORY: The patient lives with his sister. His grandchildren come and help out.  He is a prior long-term smoker but quit within the last year. He is a prior drinker but does not drink any further. No injecting drug use history.  FAMILY HISTORY: Negative for tuberculosis.   REVIEW OF SYSTEMS:  GENERAL: Positive fevers, chills recently. Positive malaise and general fatigue. Positive slow progressive weight loss for the last several years.  HEENT: No headaches. No sinus congestion. No sore throat.  NECK: No swollen glands.  RESPIRATORY: No cough, no shortness of breath, no sputum production.  CARDIAC: No chest pains or palpitations. No peripheral edema.  GASTROINTESTINAL: He had some nausea and vomiting a week or so ago, but these symptoms have all improved.  GENITOURINARY: No complaints.  MUSCULOSKELETAL: He has no focal muscle pain or aches. No joint complaints.  SKIN: No rashes.  NEUROLOGIC: He has generalized weakness and is having more unsteadiness. He ambulates with a walker.  PSYCHIATRIC: No complaints.  All other systems are negative.   PHYSICAL EXAMINATION: VITAL SIGNS: T-max of 98.2, T-current of 97.8, pulse of 64, blood pressure 137/47, 100% on 2 liters.  GENERAL: An 81 year old black man, thin, in no acute distress.  HEENT: Normocephalic, atraumatic. Pupils are equal and  reactive to light. Extraocular motion intact. Sclerae, conjunctivae, and lids without evidence for emboli or petechiae. Oropharynx shows no erythema or exudate. Teeth are in poor  condition with several teeth missing.  NECK: Midline trachea. No lymphadenopathy. No thyromegaly.  CHEST: Clear to auscultation bilaterally with good air movement. No focal consolidation.  CARDIAC: Regular rate and rhythm without murmur, rub, or gallop.  ABDOMEN: Soft, nontender and nondistended. No hepatosplenomegaly. No hernia is noted.   EXTREMITIES: No evidence for tenosynovitis. He has a fistula in the left arm that has a good thrill.  SKIN: No rashes. No stigmata of endocarditis, specifically no Janeway lesions nor Osler nodes.  NEUROLOGIC: The patient was awake and interactive, moving all four extremities.  PSYCHIATRIC: Mood and affect appeared normal.   LABORATORY AND RADIOLOGICAL DATA:  BUN 59, creatinine 7.68, bicarbonate of 24, anion gap of 8.0. LFTs are unremarkable. White count is 3.0 with a hemoglobin 9.8, platelet count of 249, ANC of 2.1. White count on admission was 7.8. Urinalysis was unremarkable. His ABG on admission was 7.19/88/90/98.6% on nasal cannula at an unknown number of liters.   Chest x-ray showed no acute infiltrates. A CT scan of the chest without contrast showed a stable left  apical cavity with a soft tissue mass within the cavity. There was adjacent bronchiectasis and air space disease, but it did not appear to be significantly changed from 04/12/2012. CT scan of the head without contrast showed no acute intracranial process.   IMPRESSION: This is an 81 year old male with a history of active pulmonary tuberculosis, chronic obstructive pulmonary disease, end-stage renal disease on hemodialysis, and a cavitary lung mass who was admitted with shortness of breath, and weakness, and hypercarbic respiratory failure.   RECOMMENDATIONS: 1. His cavitary lung disease is unchanged in size by CT. Work-up in the past has been extensive. It is likely related to his prior active TB. He has undergone bronch and multiple AFB sputum collections which have been negative for currently active TB. No isolation treatment or further work-up is required.  2. His PET scan was positive in the past. This suggests an active process in the lung but does not distinguish between infection and malignancy. It has not seemingly progressed over the last many months. He does have an area within the cavity that may be a fungal ball. There  is no need to treat this.  3. There is no need for antibiotics at this time.  4. We will sign off. Please feel free to call with any questions.  This was a low-level infectious disease consult. Thank you very much for involving me in this patient's care.   ____________________________ Rosalyn GessMichael E. Blocker, MD meb:cb D: 07/31/2012 15:05:36 ET T: 07/31/2012 15:26:21 ET JOB#: [id number]  cc: Rosalyn GessMichael E. Blocker, MD, <Dictator> MICHAEL E BLOCKER MD ELECTRONICALLY SIGNED 08/01/2012 9:35

## 2014-09-20 NOTE — Discharge Summary (Signed)
PATIENT NAME:  [name], [name] MR#:  [id number] DATE OF BIRTH:  1927/10/13  DATE OF ADMISSION:  07/31/2012 DATE OF DISCHARGE:  08/09/2012  ADMISSION DIAGNOSIS:  Generalized weakness and dehydration.   FINAL DIAGNOSES: 1.  Failure to thrive.  2.  Altered mental status due to CO2 narcosis. Metabolic encephalopathy.  3.  Generalized weakness, possible occult malignancy versus significant obstructive sleep apnea/CO2 retention.  4.  Lung cavitary lesion worked up in the past with no results on biopsy or cultures.  5.  Remote history of tuberculosis.  6.  Acute hypercarbic respiratory failure.  7.  End-stage renal disease.  8.  Depression.   IMPORTANT RESULTS: Glucose has been maintained around 100 to 170, his BUN is maintained around 39 to 55, his creatinine today is 3.6 but has been up to 7. He receives  hemodialysis 3 times a week. His sodium is usually on the low side between 133 and 136. His phosphorus was 2.1. His hemoglobin is around 9.5 to 10, his white count is has been normal at 8.7. ABG has been done with nasal cannula overnight study showing a pCO2 of 64 and a pO2 of 71 with a pH of 7.32.  Then the patient was put on a CPAP overnight on the night of the 10th and it was reported as a nasal cannula but the patient was on a CPAP showing a pCO2 of 68 and a pO2 of 46, which denotes a failure of the CPAP. Multiple CT scans have been done. CT scan of his chest without contrast shows stable left apical cavity with a soft tissue mass within the cavity that might represent fungus ball or tuberculosis. As mentioned above, the patient has been seen by ID.  He has been fully cultured, biopsies have been taken in the past and there are no signs of tuberculosis or tumor or fungus. Please see ID consultation. He had a CT scan of the head without any significant intracranial process.   MEDICATIONS AT DISCHARGE: Advair Diskus 250/50 two times a day, albuterol  nebulizers every 6 hours, BiPAP at bedtime, Coreg  12.5 mg twice a day, Combivent 20/100 one puff 4 times a day, losartan 100 mg once a day, Megace 400 mg once a day, sertraline 25 mg once a day, simethicone p.r.n.   CONSULTANTS: Dr. Edmonia Sonu, nephrology; Dr. Leavy Cella, ID; Dr. Cherylann Ratel, nephrology; Dr. Freda Munro, pulmonary.   HOSPITAL COURSE: The patient is a very nice 81 year old gentleman admitted on March, 1, 2014, with a history of pretty much being weak with failure to thrive. The patient has been admitted on previous hospitalizations for the same reason. Please refer to my H and P done on the day of admission for more details.   The patient was admitted mostly with a small COPD exacerbation, did not go to dialysis because he was really weak, not being able to regain energy since the last discharge, not eating and he has been taking some medications to increase his appetite including Megace and Remeron. The Remeron gave him hallucination for which the family stopped it. The family actually has been very supportive to him, trying to keep him at home. He lives with his niece and nephew. They state that he has not been eating well and occasionally has stomach upset. He feels very full, cannot eat.  Some of the days he just does not drink enough fluids and gets dehydrated. Because of the cavitary lesion seen on CT scan and his previous workup, we decided to  consult ID.  ID really did not feel like there was anything else to do as the patient has been seen on multiple occasions where this cavitary lesion has been unchanged and the workup has been extensive including AFB and sputum collection, biopsies. He had a PET scan which was positive in the past but there was no distinction between infection or malignancy. He could have a fungal ball that apparently Dr. Leavy CellaBlocker said does not need to treat this.   During this hospitalization, the patient has ups and downs. Apparently, in the middle of the hospitalization he started getting very lethargic. ABGs were  done showing CO2s of 88. The patient was put on BiPAP and the symptoms pretty much resolved. He was more awake and alert. He was kept on BiPAP overnight and he was more awake during days but he started refusing to use it and he started getting lethargic during the day again. Please see the mentioning on the labs about CPAP versus no CPAP use. As far as I can tell, the CPAP failed for what he would require to be on BiPAP overnight.   The patient has been told on multiple occasions that he could qualify for hospice but he is not ready to quit hemodialysis. The family supported him about this situation. Overall, he has not recovered very well. He is still very weak. He will require a lot of physical therapy for what we have consulted LTAC to take him.  Apparently, the patient is being taken for the LTAC today for what we are going to let discharge him discharge.  Dr. Hilda LiasVivek Sainani has seen the patient today and he will be taking care of the discharge.   For now, I spent about 35 minutes with this discharge.      ____________________________ Larry Furnaceoberto Sanchez Gutierrez, MD rsg:cs D: 08/09/2012 14:26:42 ET T: 08/09/2012 14:53:35 ET JOB#: [id number]  cc: Larry Furnaceoberto Sanchez Gutierrez, MD, <Dictator> Bryona Foxworthy Juanda ChanceSANCHEZ GUTIERRE MD ELECTRONICALLY SIGNED 08/24/2012 17:20

## 2014-09-20 NOTE — H&P (Signed)
PATIENT NAME:  [name], [name] MR#:  [id number] DATE OF BIRTH:  02-19-1928  DATE OF ADMISSION:  07/29/2012  REFERRING PHYSICIAN:  Maricela BoLuna Ragsdale, MD.  PRIMARY CARE PHYSICIAN:  Beverely RisenFozia Khan, MD  NEPHROLOGIST:  Dr. Cherylann RatelLateef.   CHIEF COMPLAINT:  Generalized weakness and dehydration.   HISTORY OF PRESENT ILLNESS:  The patient is a very nice 81 year old gentleman who I had the pleasure to know from a previous admission. Today, is 8282 and he has a history of end-stage renal disease. He goes of dialysis usually Tuesday, Thursday, and Saturdays. He also has anemia of chronic kidney disease, hypertension, COPD, and emphysema due to previous history of severe smoking.   The patient has been admitted on several occasions, the last time that he was discharged from here was in December, and it seems to be the same complaints on the same lines of weakness, COPD exacerbations or issues surrounding the same type of symptoms. The patient apparently did not go to dialysis because he has been really weak. Once he was discharged from here the last time, he was able to regain some of his energy. He was put on Megace and apparently he has been given some Remeron in the past, but he stopped it due to hallucinations. He did not have Remeron the last time that he was discharged, mostly sertraline, and apparently he has been taking it on a regular basis, but the family seems to be afraid of starting Remeron at this moment. The patient apparently came with a GI virus last week and since then everything is starting to declined significantly. He has lost more weight. He has been really dehydrated, not able to go to dialysis, not able to get out of the bed even. On a regular basis, the patient is a big breakfast either and sometimes that is the only meal that he has a day, some more times he squeeze 1 or 2 more small meals in the day but the last couple of days he has not even had breakfast because he is not feeling very well. He does not  have any stomach upset or abdominal upset. He just feels like he cannot eat. The patient has missed his dialysis on Tuesday for what the dialysis day was changed for Wednesday and on Thursday he did not have dialysis. Today, on Saturday, he is not able to have dialysis at all. The patient has been complaining of increased shortness of breath and increased wheezing, although he denies any increased secretions or fever. The patient has not been able to take his medications due to feeling really weak. Overall he is admitted today due to dehydration, increase of baseline creatinine, and significant weakness with failure to thrive.   I discussed the case with Dr. Wynelle LinkKolluru. Looking at his x-ray, he has this cavitary lesion on the left upper lobe. His last CT scan done on November showed the lesion and at this moment Dr. Wynelle LinkKolluru wants a repeat CT scan to rule out any pathology. The patient has occasional night sweats, but he has been is tested enough for tuberculosis with FFPs on several occasions and they have been negative so far. He is PPD positive. At this moment the most likely etiology of this cavitary lesion is a cancerous lesion. So we are going to repeat the CT scan and see if there is anything else that could give us a clue as to what the etiology is.  REVIEW OF SYSTEMS:  CONSTITUTIONAL:  The patient feels fatigued. No fever,  significant weakness is positive. The patient denies any pain. Positive weight loss. His current weight is 87 pounds.  EYES:  No blurry vision or changes on vision in general. No inflammation of the eyes.  ENT:  No difficulty swallowing. No ear pain. No tinnitus. No nasal discharge.  RESPIRATORY:  Positive cough, which is chronic. The patient says that he has been wheezing a little bit more lately. Denies any hemoptysis, denies any painful respirations. He does have COPD. He used to be a heavy smoker.  CARDIOVASCULAR:  No chest pain. No orthopnea, no edemas. No syncopal episodes.   GASTROINTESTINAL:  No nausea, vomiting, or diarrhea at this moment. He has had episodes of diarrhea, nausea, vomiting last week but they have resolved. It has been at least 7 day since the symptoms resolved. He had a possibly Norovirus. Denies any blood in the stool.  GENITOURINARY:  No dysuria or hematuria. He makes very small amounts of urine.  ENDOCRINE:  No polyuria or polydipsia, or polyphagia. No cold or heat intolerance. HEMATOLOGIC AND LYMPHATIC:  No easy bruising, positive chronic disease anemia. No swollen glands.  SKIN:  Without any significant rashes or petechiae. He has very dry skin.  MUSCULOSKELETAL:  No significant back pain, neck pain. The patient just feels really weak. No gout. No swelling joints.  NEUROLOGIC:  No numbness. No tingling. No CVAs. No TIAs.  PSYCHIATRIC:  No significant anxiety. The patient does not necessarily feel depressed, but the family has concerns about the patient possibly being depressed.   PAST MEDICAL HISTORY:  1.  End-stage renal disease on dialysis Tuesday, Thursday, Saturday.  2.  Chronic obstructive pulmonary disease.  3.  Hypertension.  4.  GERD.  5.  Anemia of chronic disease.  6.  BPH.  7.  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  8.  A remote history of tuberculosis.  9.  Irritable bowel syndrome.  10.  Secondary hyperparathyroidism.  11.  Seasonal allergies.  12.  Failure to thrive.  13.  Severe malnutrition.   PAST SURGICAL HISTORY:  1.  Recent lung biopsy.  2.  A history of AV fistula.   SOCIAL HISTORY:  The patient used to smoke for most of his life. He quit about 4 months ago. He used to smoke 1 pack a day. He does not drink. He does not use any drugs. He lives with his niece who takes a good care of him.   FAMILY HISTORY:  No coronary artery disease, diabetes, or hypertension. His parents died from old age.   ALLERGIES:  THE PATIENT IS ALLERGIC TO ASPIRIN, CONTRAST AND SULFA.   CURRENT  MEDICATIONS:  Simethicone 80 mg 4 times a day, sertraline 25 mg once a day, Megace 400 mg once a day, losartan 100 mg once a day, Combivent inhaled, carbimazole 12.5 mg twice daily, albuterol ipratropium nebulizers inhaled 4 times a day, Advair 250 mcg 2 puffs twice daily.   PHYSICAL EXAMINATION:  VITAL SIGNS:  Blood pressure 150/54, pulse 86, respiratory 25, temperature 97.4. Oriented x 3. Severely debilitated and moderate dehydrated. A very pleasant patient is alert, oriented x 3. No acute distress.  HEENT:  His pupils are equal and reactive. Extraocular movements are intact. Mucosa are dry. Anicteric sclerae. Pink conjunctivae. No oral lesions. No oropharyngeal exudates.  NECK:  Supple. No JVD. No thyromegaly. No adenopathy. A very thin neck.. No rigidity, no lymphadenopathy.  CARDIOVASCULAR:  Regular rate and rhythm. No murmurs, rubs, or gallops. No  displacement of PMI. No tenderness to palpation of anterior chest.  LUNGS:  Low-intensity wheezing in both lungs is heard at this moment, but no rales. There is decrease of respiratory sounds on the left apical areas with dullness to percussion of that area. No dullness to percussion on any other areas.  ABDOMEN:  Soft, nontender, nondistended. No hepatosplenomegaly. No masses. Bowel sounds are positive. No rebound.  GENITALIA:  No external lesions.  EXTREMITIES:  No edema, no cyanosis, no clubbing. Pulses +2. Capillary refill less than 3.  MUSCULOSKELETAL:  No significant joint effusions. Very debilitated, significant atrophy of muscles upper and lower extremities. Strength is 4/5 in 4 extremities.  NEUROLOGIC:  Cranial nerves II through XII intact.  PSYCHIATRIC:  No significant anxiety or agitation. The patient is alert and oriented x 3.  LYMPHATIC:  Negative for lymphadenopathy in the neck, supraclavicular area, axillary or groin.   LABORATORY, DIAGNOSTIC, AND RADIOLOGICAL DATA:  Creatinine is 6.97, his baseline is around initially 2 to 3. When he  was discharged last time his creatinine was 4.35, but overall he is down to 2 to 3 after diuresis. It is unclear baseline. Electrolytes within normal limits. Calcium is 8, albumin is 3. LFTs within normal limits. Troponin is negative. White count is 7.8, hemoglobin is 10.6, platelets 249. UA 6 white blood cells but not really signs of infection. In the past he has had parathyroid hormone of 50. Hepatitis panel was negative. FFPs have been negative all along since 12/2011 and down to 11/2011. Tracheobronchial and lung workup has shown normal bronchial cells with inflamation in the past. He has had Klebsiella pneumonia on bronchial wash in the past. Chest x-ray, as mentioned above, he has a left apical cavitary lesion.   EKG:  Normal sinus rhythm, may be some LVH.   ASSESSMENT AND PLAN:  1.  Dehydration. The patient is admitted for treatment of this condition. He is going to be on IV fluids at 40 mL an hour. We are going to hold on dialysis today. His dialysis schedule might need to be reinvented due to his severe symptoms of dehydration and significant weight loss, maybe he just needs to do it twice weekly instead of 3 times weekly, and he needs a close evaluation. The patient is going to be encouraged to have an increase of his water a say, may be instead of 64 ounces a day increased to close to 70. I gave a good explanation to the family about weight measurement that might be the best way to monitor his water loss or water gain. The patient is going to be admitted at this moment for treatment of dehydration. As far as seen here for dehydration his skin is very dry, mucosa are very dry. He was orthostatic with a significant blood pressure drop while standing of more than 20 points. and his creatinine has bumped up from 4 the last time up to 6. His baseline could be somewhere around 3 to 4, but I do not have a clear baseline at this moment. 2.  Failure to thrive. The patient continues to have this problem. I  think on the case of this significant weight loss, he might have an occult cancer likely in the lung. He has this cavitary lesion. Dr. Wynelle Link order a repeat CT scan. He has had multiple scans, multiple tests tuberculosis, which have been negative, and he also had a biopsy that showed scar tissue at [facility] and here he has had a bronchoscopy that showed normal  cells, normal bronchial alveolar cells. At this moment, he has been worked out plenty, but I think that overall he could still have an occult cancer. We are not going to check FFPs this time since they have been check plenty.  3.  Weight loss and decreased appetite. We are going to do several measures at this moment:  (1) We are going to increase his Megace to twice daily 800 mg a day, instead of 400. (2)  Continue shakes with nephro at least twice daily.  4.  We are going to add a psychotropic medication. Olanzapine has been shown to have benefits about weight gain on elderly population, especially depressed population. The patient apparently cannot tolerate mirtazapine, which would be my first option.  5.  Due to possible depression, I am going to add on a dextroamphetamine really in general to increase his energy.  6.  The patient is going to be taking dexamethasone as well that will increase weight, his energy and appetite. This is going to be for short term and is going to be focused mostly on a chronic obstructive pulmonary disease exacerbation although we can kill 2 birds with the same stone by using this medication.  7.  Nutritional support and possibility of the patient being discharged on Home Health to help with these issues.  8.  Chronic obstructive pulmonary disease. The patient had a mild exacerbation. We are going to continue inhalers. We are going to continue steroids with dexamethasone as mentioned above. I do not think that he requires any antibiotics at this moment,  9.  End-stage renal disease as mentioned above, possibly needing  to cut down his dialysis a little bit.  10.  Hypertension. Continue treatment with carbimazole. I am going to hold the losartan since his creatinine bumped up.  11.  Acute on chronic kidney failure. Give him IV fluids due to prerenal azotemia.  12.  Benign prostatic hypertrophy. Continue monitoring and treatment as needed. The patient is at this moment is not taking any medications for it and he does not seem to be symptomatic.  13.  Irritable bowel syndrome. Continue simethicone as needed.  14.  Positive left lung apical mass as mentioned above. We are going to waited for a CT scan today and see if we need to proceed any other way. I doubt if there is anything else to do at this moment, but at least this CT might guide [place] on a possible etiology. 15.  I am going to consult. Palliative Care on this patient for multiple admissions due to the same diagnosis.  16.  Other medical problems seem to be stable. PPI for gastrointestinal prophylaxis. Heparin for deep vein thrombosis prophylaxis.   TIME SPENT:  I spent about an hour with this patient.  ____________________________ Felipa Furnace, MD rsg:jm D: 07/29/2012 14:54:00 ET T: 07/29/2012 16:10:05 ET JOB#: [id number]  cc: Felipa Furnace, MD, <Dictator> Lindell Tussey Juanda Chance MD ELECTRONICALLY SIGNED 08/24/2012 17:19

## 2014-09-20 NOTE — Consult Note (Signed)
Impression: 81yo BM w/ h/o active pulmonary TB, COPD, ESRD, cavitary lung mass admitted with SOB and weakness.  His cavitary lung disease is unchanged in size by CT.  Workup in the past has been extensive.  It is likely related to his prior active TB.  He has undergone bronch and multiple AFB sputum collections which have been negative for TB.  No isolation, treatment or further work up is needed. His PET scan was positive in the past.  This suggest an active process in the lung, but does not distinguish between infection and malignancy.  He has not seemingly progressed over time.  He does have an area within the cavity that may be a fungal ball.  There is no need to treat this. No need for antibiotics at this time. Will sign off.  Please call with questions.   Electronic Signatures: Blocker MPH, Rosalyn GessMichael E (MD)  (Signed on 03-Mar-14 14:56)  Authored  Last Updated: 03-Mar-14 14:56 by Blocker MPH, Rosalyn GessMichael E (MD)

## 2014-09-22 NOTE — Op Note (Signed)
PATIENT NAME:  [name], [name] MR#:  [id number] DATE OF BIRTH:  01/30/1928  DATE OF PROCEDURE:  09/06/2011  PREOPERATIVE DIAGNOSES:  1. Severe hyperkalemia with a potassium 6.7.  2. Chronic kidney disease nearing dialysis dependence.  3. Hypertension.  4. Chronic obstructive pulmonary disease.  POSTOPERATIVE DIAGNOSES:  1. Severe hyperkalemia with a potassium 6.7.  2. Chronic kidney disease nearing dialysis dependence.  3. Hypertension.  4. Chronic obstructive pulmonary disease.  PROCEDURES PERFORMED: 1. Ultrasound guidance for vascular access, right femoral vein.  2. Placement of Trialysis type dialysis catheter, right femoral vein.   SURGEON: Annice NeedyJason S. Dew, M.D.   ANESTHESIA: Local.   ESTIMATED BLOOD LOSS: 25 mL.  FLUOROSCOPY: None.   CONTRAST USED: None.   INDICATION FOR PROCEDURE: The patient is an 81 year old African American male with chronic kidney disease. He was scheduled to have an outpatient fistulogram/fistula declot today; however, when he arrived his potassium was markedly elevated at 6.7 and was not safe to proceed with the fistula intervention. In discussions with Dr. Thedore MinsSingh from the nephrology service, we recommended placement of a non-tunneled dialysis catheter and admission to the hospital for creatinine clearance monitoring and hemodialysis for his severe hyperkalemia. Once this is corrected, his fistula evaluation can be conducted and he may need a more permanent type of dialysis access if his clearance below 15.   DESCRIPTION OF PROCEDURE: The patient's groin was sterilely prepped and draped and a sterile surgical field was created. The right femoral vein was visualized with ultrasound and found to be widely patent. It was then accessed under direct ultrasound guidance without difficulty with a Seldinger needle and permanent image was recorded. A J-wire was placed. After skin nick and dilatation, the Trialysis type dialysis catheter was placed over the wire and the  wire was removed. All three ports withdrew dark red nonpulsatile blood and flushed easily with heparinized saline and was then secured to the skin at 30 cm with three nylon sutures. ____________________________ Annice NeedyJason S. Dew, MD jsd:slb D: 09/06/2011 09:20:25 ET T: 09/06/2011 13:09:00 ET JOB#: [id number]  cc: Annice NeedyJason S. Dew, MD, <Dictator> Annice NeedyJASON S DEW MD ELECTRONICALLY SIGNED 09/06/2011 13:44

## 2014-09-22 NOTE — Op Note (Signed)
PATIENT NAME:  [name], [name] MR#:  [id number] DATE OF BIRTH:  Dec 21, 1927  DATE OF PROCEDURE:  09/29/2011  PREOPERATIVE DIAGNOSIS: Stage IV renal insufficiency.   POSTOPERATIVE DIAGNOSIS: Stage IV renal insufficiency.   PROCEDURE PERFORMED: Creation of a left arm brachiocephalic fistula.   SURGEON: Renford DillsGregory G. Ceili Boshers, MD  ANESTHESIA: General by LMA.   FLUIDS: Per anesthesia record.   ESTIMATED BLOOD LOSS: Minimal.   SPECIMEN: None.   INDICATIONS: [name] is an 81 year old gentleman who has stage IV renal insufficiency. His renal disease has been steadily progressing and he is likely to initiate dialysis within the year. He is therefore undergoing creation of an upper extremity access. He did have a radiocephalic fistula created but this thrombosed after approximately three months and he is therefore undergoing creation of a brachiocephalic. Risks and benefits were reviewed. Patient has agreed to proceed.   DESCRIPTION OF PROCEDURE: Patient is taken to the Operating Room, placed in supine position. After adequate general anesthesia is induced, appropriate invasive monitors are placed, he is positioned supine with his left arm extended palm upward. Left arm is prepped and draped in a sterile fashion. 0.25% Marcaine is infiltrated in the soft tissues just above the antecubital crease and the dissection is carried down to expose the cephalic vein which is noted to be soft and patent and adequate for creation of a fistula. Brachial artery is then exposed and looped proximally and distally. The cephalic vein is then marked with a surgical marker to maintain orientation. It is ligated distally with 2-0 silk tie and transected. Garrett coronary dilators are used to dilate the vein to 3.5 mm. A 2.7 mm aortic punch is then used to create an arteriotomy, and an end-vein-to-side brachial artery anastomosis is fashioned with running 6-0 Prolene. Flushing maneuvers are performed and flow was established  to the hand. Palpable thrill is noted. Palpable radial pulse is noted. Wound is then irrigated and closed in layers using interrupted 3-0 Vicryl followed by 4-0 Monocryl subcuticular and then Dermabond. Patient tolerated procedure well and there were no immediate complications. Sponge and needle counts are correct. He is taken to the recovery room in excellent condition.   ____________________________ Renford DillsGregory G. Gorman Safi, MD ggs:cms D: 09/29/2011 19:02:58 ET T: 09/30/2011 10:39:20 ET JOB#: [id number]  cc: Renford DillsGregory G. Demetrice Combes, MD, <Dictator> Munsoor Lizabeth LeydenN. Lateef, MD Lyndon CodeFozia M. Khan, MD Renford DillsGREGORY G Teaira Croft MD ELECTRONICALLY SIGNED 10/08/2011 7:52

## 2014-09-22 NOTE — H&P (Signed)
PATIENT NAME:  [name], [name] MR#:  [id number] DATE OF BIRTH:  April 02, 1928  DATE OF ADMISSION:  09/06/2011  REFERRING PHYSICIAN: Dr. Thedore Mins  PRIMARY CARE PHYSICIAN: Beverely Risen, MD  PRIMARY NEPHROLOGIST: Mady Haagensen, MD  REASON FOR ADMISSION: Hyperkalemia.   HISTORY OF PRESENT ILLNESS: The patient is a very pleasant 81 year old male with past medical history as listed below including hypertension, Stage IV-V CKD, COPD, tobacco abuse, history of partial small bowel obstruction, anemia of chronic kidney disease, and history of unintentional weight loss with essentially nonrevealing work-up with colonoscopy February 2009 revealing rectal polyp, diverticulosis, and hemorrhoids, and benign EGD as well as capsule endoscopy who was brought to special procedures today to undergo declotting of his AV fistula, but was noted to have hyperkalemia with serum potassium level of 6.7 so the planned procedure was canceled and thereafter hospitalist services were contacted for further evaluation and for hospital admission. The patient is being seen in the specials procedure unit right now and just underwent non-tunneled temporary dialysis (trialysis) catheter placed in the right femoral vein by Dr. Wyn Quaker. He is currently resting comfortably and denies any specific complaints at this time.   PAST SURGICAL HISTORY:  1. Left radiocephalic AV fistula creation on 06/23/2011 with revision of fistula the same day.  2. Colonoscopy in February 2009. 3. EGD in November 2008.  4. Temporary non-tunneled dialysis catheter placement in the right femoral vein today by Dr. Wyn Quaker (09/06/2011).   PAST MEDICAL HISTORY:  1. Hypertension.  2. Tobacco abuse.  3. Chronic obstructive pulmonary disease.  4. Stage IV-V CKD (likely nearing ESRD) status post AV fistula creation, left upper extremity (which currently appears to be non-functional).  5. History of Unintentional weight loss with extensive work up in the past (which has been  essentially non-revealing).  6. History of partial small bowel obstruction.  7. Nonbleeding rectal polyp. 8. Sigmoid and descending colon diverticulosis. 9. Internal hemorrhoids.  10. Unspecified mucosal abnormality in the duodenum likely from old scar, noted on EGD in November 2008.  11. Unremarkable capsule endoscopy in 2009.  12. Anemia of chronic kidney disease, followed by Dr. Janese Banks.  ALLERGIES: Aspirin is listed as an allergy, but causes GI distress. However, the patient states he can tolerate this well and takes aspirin at home. The patient also is allergic to contrast, iodinated radiocontrast dye, and sulfa drugs.   HOME MEDICATIONS:  1. Aspirin 81 mg p.o. daily.  2. Fexofenadine 60 mg p.o. every 12 hours.  3. Vitamin D 250,000 international units (1.25 mg) 1 tab p.o. weekly.  4. Procrit injectable monthly.  5. Omeprazole 20 mg daily.  6. Fish oil 1000 mg three times daily.  7. Amlodipine 10 mg p.o. daily.  8. Advair Diskus 250/50 one dose inhaled twice a day.    FAMILY HISTORY: Positive for hypertension.   SOCIAL HISTORY: Tobacco, states he quit approximately two weeks ago. He has a 30 pack-year history of smoking. Alcohol - none. Illicit drugs - none.   REVIEW OF SYSTEMS: CONSTITUTIONAL: Denies fevers or chills. Chronic weight loss. No weakness. HEAD/EYES: Denies headache or blurry vision. ENT: Denies tinnitus, earache, nasal discharge, or sore throat. RESPIRATORY: Denies shortness breath, cough, or wheezing. CARDIOVASCULAR: Denies chest pain, heart palpitations, or lower extremity edema. GASTROINTESTINAL: Denies nausea, vomiting, diarrhea, constipation, melena, hematochezia, or abdominal pain. GENITOURINARY: Denies dysuria or hematuria. ENDOCRINE: Denies heat or cold intolerance. HEME/LYMPH: Denies easy bruising or bleeding. INTEGUMENTARY: Denies rash or lesions. MUSCULOSKELETAL: Denies joint pain or muscle weakness currently. NEUROLOGIC:  Denies headache, numbness,  weakness, tingling, or dysarthria. PSYCHIATRIC: Denies depression or anxiety.     PHYSICAL EXAMINATION:   VITAL SIGNS: Temperature afebrile, pulse 60, blood pressure 135/78, respirations 18, and oxygen saturation 98% on room air.   GENERAL: Cachectic appearing male who is currently alert and oriented, not acutely distressed.   HEENT: Normocephalic, atraumatic. Pupils are equal, round and reactive to light accommodation. Extraocular muscles are intact. Anicteric sclerae. Conjunctivae pink. Hearing intact to voice. Nares without drainage. Oral mucosa moist without lesions.   NECK: Supple with full range of motion. No jugular venous distention, lymphadenopathy, or carotid bruits bilaterally. No thyromegaly or tenderness to palpation over thyroid gland.  CHEST: Normal respiratory effort without use of accessory respiratory muscles. The patient has decreased breath sounds bilaterally with hyperresonance to percussion. No crackles, rales, wheezing, or rhonchi.   CARDIOVASCULAR: Distant S1 and S2. Regular rate and rhythm.. No murmurs, rubs, or gallops. PMI is non-lateralized.   ABDOMEN: Soft, nontender, and nondistended. Normoactive bowel sounds. No hepatosplenomegaly or palpable masses. No hernia.   EXTREMITIES: No clubbing, cyanosis, or edema. Pedal pulses are palpable bilaterally.   VASCULAR ACCESS:  The patient has a left radiocephalic AV fistula with good palpable thrill noted, but could not auscultate a bruit with no overlying warmth/erythema/or tenderness to palpation involving his AV fistula. He also now has right groin (femoral vein) temporary dialysis catheter noted and site is clean/dry/intact.   SKIN: No suspicious rashes, ulcers, or lesions. Skin turgor is good.   LYMPH: No cervical lymphadenopathy.  NEURO: The  patient is alert and oriented x3. Cranial nerves II through XII grossly intact. No focal deficits.   PSYCH: Pleasant male with appropriate affect.   LABS/STUDIES: Only  lab currently available is a serum potassium of 6.7.   ASSESSMENT AND PLAN: The patient is a pleasant 81 year old male with past medical history of hypertension, tobacco abuse, chronic obstructive pulmonary disease, stage IV-V CKD, status post left upper extremity fistula creation, and anemia of chronic kidney disease who is here to undergo declotting of his AV fistula, but noted to be hyperkalemic with potassium of 6.7 and  the procedure is now postponed with: 1. Hyperkalemia - we will admit the patient to the hospital and monitor the patient on telemetry. Obtain a stat EKG and will give calcium gluconate if there are any associated EKG changes. The patient will be started on urgent hemodialysis for correction of his hyperkalemia and in the meanwhile we will give a stat dose of Kayexalate, insulin (followed by dextrose), and bicarbonate and followup serum potassium level thereafter. Nephrology will be following the patient in house and Dr. Thedore Mins is already aware. Further work-up and management to follow depending on the patient's clinical course.  2. 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 Dialysis will be arranged by Dr. Thedore Mins. Fistulogram and possible declotting of the patient's AV fistula has been postponed for now due to hyperkalemia and the patient has underwent temporary dialysis catheter insertion in the right femoral vein and appreciate Dr. Driscilla Grammes help in this regard. Will obtain a 24 hour urine collection for creatinine clearance.  We will follow the patient's renal function and electrolytes closely with dialysis and await further recommendations from Dr. Thedore Mins.  3. Hypertension - blood pressure currently well-controlled, continue Norvasc and monitor blood pressure closely.  4. COPD - stable and without acute exacerbation. We will continue Advair and also write for p.r.n. albuterol metered  dose inhaler.   5. Anemia of chronic kidney disease - need to check some baseline labs and we will check CBC to assess the patient's hemoglobin and hematocrit. He can receive Procrit with dialysis, as per nephrology's recommendations.  6. Deep vein thrombosis prophylaxis - subcutaneous heparin as long as he is not profoundly anemic or thrombocytopenic. Await CBC and in the meanwhile we will place SCDs and TEDs to the patient's bilateral lower extremities.            CODE STATUS: FULL CODE.   TIME SPENT ON ADMISSION PROCESS: Approximately 45 minutes. ____________________________ Elon AlasKamran N. Mackenzy Eisenberg, MD knl:slb D: 09/06/2011 10:19:49 ET T: 09/06/2011 11:46:44 ET JOB#: [id number]  cc: Elon AlasKamran N. Elgin Carn, MD, <Dictator> Lyndon CodeFozia M. Khan, MD Mosetta PigeonHarmeet Singh, MD Munsoor Lizabeth LeydenN. Athalene Kolle, MD Elon AlasKAMRAN N Trachelle Low MD ELECTRONICALLY SIGNED 09/08/2011 23:21

## 2014-09-22 NOTE — Op Note (Signed)
PATIENT NAME:  [name], [name] MR#:  [id number] DATE OF BIRTH:  Feb 13, 1928  DATE OF PROCEDURE:  06/23/2011  PREOPERATIVE DIAGNOSIS: Stage-V renal insufficiency.  POSTOPERATIVE DIAGNOSIS: Stage-V renal insufficiency.   PROCEDURE PERFORMED: Creation of a left radiocephalic fistula.   SURGEON: Levora DredgeGregory Juliette Standre, MD  ASSISTANT: Philomena CourseJason Kaylor, PA-C  ANESTHESIA: General by LMA.   FLUIDS: Per anesthesia record.   ESTIMATED BLOOD LOSS: Minimal.   SPECIMEN: None.   INDICATIONS: [name] is an 81 year old gentleman who presents with worsening renal function and will require hemodialysis. He is therefore undergoing creation of dialysis access. The risks and benefits were reviewed, all questions are answered, and the patient agrees to proceed.   DESCRIPTION OF PROCEDURE: The patient is taken to special procedures and placed in the supine position. After adequate sedation is achieved, the left arm is prepped and draped in sterile fashion. An incision is made midway between the vein and the radial artery, access is carried down after 0.25% Marcaine is infiltrated in the soft tissues, and the radial artery is looped proximally and distally. The cephalic vein is then dissected circumferentially, marked with a surgical marker and transected distally with a 3-0 silk tie. Arteriotomy is made in the radial artery and extended with an aortic punch. The vein is then anastomosed using interrupted 6-0 Prolene in an end-to-side fashion. Flushing maneuvers are performed and flow is established to the fistula which immediately demonstrates dilatation of the cephalic vein up to the proximal forearm. Three distinct side branches are noted, small incisions are made and these are ligated with silk ties. All the wounds are irrigated and then closed using 3-0 Vicryl followed by 4-0 Monocryl subcuticular. The patient tolerated the procedure well. There were no immediate complications. He was taken to the Recovery Room in  stable condition.  ____________________________ Renford DillsGregory G. Jonisha Kindig, MD ggs:slb D: 06/23/2011 10:10:47 ET T: 06/23/2011 10:42:43 ET JOB#: [id number]  cc: Renford DillsGregory G. Danyl Deems, MD, <Dictator> Renford DillsGREGORY G Pinkey Mcjunkin MD ELECTRONICALLY SIGNED 07/14/2011 11:08

## 2014-09-22 NOTE — Op Note (Signed)
PATIENT NAME:  [name], [name] MR#:  [id number] DATE OF BIRTH:  [birth year]  DATE OF PROCEDURE:  09/08/2011  PREOPERATIVE DIAGNOSES:  1. Chronic kidney disease.  2. Thrombosis of the left radiocephalic AV fistula.  3. Hyperkalemia.   POSTOPERATIVE DIAGNOSES:  1. Chronic kidney disease.  2. Thrombosis of the left radiocephalic AV fistula.  3. Hyperkalemia.   PROCEDURE: 1. Ultrasound guidance for vascular access to radial artery at radiocephalic anastomosis.  2. Left upper extremity radial arteriogram/fistulogram.   SURGEON: Festus BarrenJason Eulene Pekar, M.D.   ANESTHESIA: Local with moderate conscious sedation.   ESTIMATED BLOOD LOSS: Minimal.   FLUOROSCOPY TIME: Less than minute.   CONTRAST USED: 5 mL.   INDICATION FOR PROCEDURE: This is an 81 year old African American male who came in for an evaluation of his fistula on Monday but was found to be severely hyperkalemic. He has been treated with dialysis and his potassium has improved. He is now brought down to see if this fistula is salvageable. Risks and benefits were discussed. Informed consent was obtained.   DESCRIPTION OF PROCEDURE: Left upper extremity was sterilely prepped and draped and a sterile surgical field was created. There was no thrill or bruit. The fistula was clearly occluded. Attempts at accessing the cephalic vein retrograde were unsuccessful and so I accessed the radial artery just below the anastomosis with a micropuncture needle, micropuncture wire and sheath were placed. Imaging was performed and showed a patent radial artery and occlusion of the AV fistula with no distal reconstitution seen and no real stump of the fistula to attempt to cross the occlusion. This is clearly a non-salvageable fistula and at this point I elected to terminate the procedure. The sheath was removed. Pressure was held. Sterile dressing was placed. The patient tolerated the procedure well.    ____________________________ Annice NeedyJason S. Huntley Demedeiros,  MD jsd:bjt D: 09/08/2011 10:04:43 ET T: 09/08/2011 11:31:25 ET JOB#: [id number]  cc: Annice NeedyJason S. Janis Cuffe, MD, <Dictator> Annice NeedyJASON S Alysabeth Scalia MD ELECTRONICALLY SIGNED 09/09/2011 12:01

## 2014-09-22 NOTE — Discharge Summary (Signed)
PATIENT NAME:  [name], [name] MR#:  [id number] DATE OF BIRTH:  Oct 07, 1927  DATE OF ADMISSION:  09/06/2011 DATE OF DISCHARGE:  09/09/2011  DISCHARGE DIAGNOSES:  1. Hypokalemia, resolved with dialysis.  2. Chronic kidney disease stage IV to V.  3. Hypertension.  4. Chronic obstructive pulmonary disease. 5. Anemia of chronic kidney disease.   DISPOSITION: Patient is being discharged home.   FOLLOW UP: Follow up with Dr. Thedore MinsSingh in 1 to 2 weeks after discharge.   DIET: 2 grams sodium and 2 gram potassium.  ACTIVITY: As tolerated.   DISCHARGE MEDICATIONS:  1. Albuterol metered dose inhaler 1 to 2 puffs q.4-6 hours p.r.n. shortness of breath. 2. Lovaza 1 gram daily.  3. Allegra 60 mg b.i.d.  4. MiraLax 17 grams daily p.r.n. constipation.  5. Toprol-XL 25 mg daily.  6. Hydralazine 25 mg b.i.d., hold for systolic blood pressure less than 100. 7. Aspirin 81 mg daily.  8. Amlodipine 10 mg daily.  9. Omeprazole 20 mg daily.  10. Advair 500/50, 1 puff b.i.d.  11. Vitamin D2 50,000 international units, 1 capsule once a week.  CONSULTATIONS:  1. Renal consultation with Dr. Thedore MinsSingh. 2. Vascular surgery consultation with Dr. Wyn Quakerew.   LABORATORY, DIAGNOSTIC AND RADIOLOGICAL DATA: Hepatitis B surface antigen negative. Kidney ultrasound showed hyperechogenic kidney consistent with history of diminished renal function, partially exophytic mass at the lower pole of the left kidney. Mass measures 1.36 cm at maximum diameter and is slightly larger than was observe on prior exam on 05/16/2010. Note is made that these findings remain most compatible with a cyst containing debris. There is incomplete emptying of the urinary bladder. No hydronephrosis is seen. White count normal. Hemoglobin 7.8 to 8.7. Platelet count 149 to 63. Potassium 6.7 on admission, 4.6 by the time of discharge. Creatinine 3.26 on admission, 2 by the time of discharge.   HOSPITAL COURSE: Patient is an 81 year old male with past medical  history of hypertension, chronic kidney disease stage IV to V, anemia of chronic disease, chronic kidney disease who [facility] declotting of his aVF but was noted to be hyperkalemic with potassium of 6.7. Patient was admitted to the hospital and the hyperkalemia was treated. Initially it responded, however, it recurred therefore patient underwent a temporary dialysis catheter placement and underwent a couple of treatments of hemodialysis. Currently his potassium has normalized and his renal function is stable. He has been counseled about 2 gram potassium diet. No ACE inhibitors are being given, given his recurrent hyperkalemia. Patient is nearing end-stage renal disease. His renal function has stabilized after hemodialysis. Declotting of the aVF was not successful. Patient will undergo elective placement of aVF as an outpatient. Patient had uncontrolled hypertension therefore multiple medications have been added to his treatment regimen including Norvasc, Toprol-XL and hydralazine. His chronic obstructive pulmonary disease remained stable. For his anemia of chronic kidney disease he received 1 unit of blood during dialysis and also Procrit. He will follow up with Dr. Thedore MinsSingh within 1 to 2 weeks after discharge.   TIME SPENT: 45 minutes.   ____________________________ Darrick MeigsSangeeta Keric Zehren, MD sp:cms D: 09/09/2011 15:57:07 ET T: 09/12/2011 14:11:04 ET JOB#: [id number]  cc: Darrick MeigsSangeeta Robert Sperl, MD, <Dictator> Darrick MeigsSANGEETA Haedyn Breau MD ELECTRONICALLY SIGNED 09/18/2011 7:18
